# Patient Record
Sex: Male | Born: 1940 | Race: Black or African American | Hispanic: No | Marital: Married | Smoking: Former smoker
Health system: Southern US, Community
[De-identification: ages and names within clinical notes are randomized; demographics above are authoritative.]

## PROBLEM LIST (undated history)

## (undated) DIAGNOSIS — G4730 Sleep apnea, unspecified: Secondary | ICD-10-CM

## (undated) DIAGNOSIS — D509 Iron deficiency anemia, unspecified: Secondary | ICD-10-CM

## (undated) DIAGNOSIS — I10 Essential (primary) hypertension: Secondary | ICD-10-CM

## (undated) DIAGNOSIS — N400 Enlarged prostate without lower urinary tract symptoms: Secondary | ICD-10-CM

## (undated) DIAGNOSIS — J309 Allergic rhinitis, unspecified: Secondary | ICD-10-CM

## (undated) DIAGNOSIS — N529 Male erectile dysfunction, unspecified: Secondary | ICD-10-CM

## (undated) DIAGNOSIS — Z860101 Personal history of adenomatous and serrated colon polyps: Secondary | ICD-10-CM

## (undated) DIAGNOSIS — E119 Type 2 diabetes mellitus without complications: Secondary | ICD-10-CM

## (undated) DIAGNOSIS — N433 Hydrocele, unspecified: Secondary | ICD-10-CM

## (undated) DIAGNOSIS — I5189 Other ill-defined heart diseases: Secondary | ICD-10-CM

## (undated) DIAGNOSIS — E7800 Pure hypercholesterolemia, unspecified: Secondary | ICD-10-CM

## (undated) DIAGNOSIS — D126 Benign neoplasm of colon, unspecified: Secondary | ICD-10-CM

## (undated) DIAGNOSIS — Z8619 Personal history of other infectious and parasitic diseases: Secondary | ICD-10-CM

## (undated) DIAGNOSIS — J449 Chronic obstructive pulmonary disease, unspecified: Secondary | ICD-10-CM

## (undated) DIAGNOSIS — M170 Bilateral primary osteoarthritis of knee: Secondary | ICD-10-CM

## (undated) DIAGNOSIS — E790 Hyperuricemia without signs of inflammatory arthritis and tophaceous disease: Secondary | ICD-10-CM

## (undated) DIAGNOSIS — E785 Hyperlipidemia, unspecified: Secondary | ICD-10-CM

## (undated) DIAGNOSIS — B9681 Helicobacter pylori [H. pylori] as the cause of diseases classified elsewhere: Secondary | ICD-10-CM

## (undated) DIAGNOSIS — D709 Neutropenia, unspecified: Secondary | ICD-10-CM

## (undated) HISTORY — PX: BACK SURGERY: SHX140

## (undated) HISTORY — PX: JOINT REPLACEMENT: SHX530

---

## 2001-11-08 HISTORY — PX: LUMBAR LAMINECTOMY: SHX95

## 2001-11-09 ENCOUNTER — Encounter: Admission: RE | Admit: 2001-11-09 | Discharge: 2001-11-09 | Payer: Self-pay | Admitting: Unknown Physician Specialty

## 2001-11-09 ENCOUNTER — Encounter: Payer: Self-pay | Admitting: Unknown Physician Specialty

## 2004-06-10 HISTORY — PX: KNEE ARTHROSCOPY: SUR90

## 2004-11-26 ENCOUNTER — Other Ambulatory Visit: Payer: Self-pay

## 2004-11-30 ENCOUNTER — Ambulatory Visit: Payer: Self-pay | Admitting: Orthopaedic Surgery

## 2005-06-10 HISTORY — PX: BACK SURGERY: SHX140

## 2005-10-15 ENCOUNTER — Ambulatory Visit: Payer: Self-pay | Admitting: Internal Medicine

## 2006-06-10 HISTORY — PX: COLONOSCOPY: SHX174

## 2006-06-16 ENCOUNTER — Ambulatory Visit: Payer: Self-pay | Admitting: Unknown Physician Specialty

## 2006-06-16 DIAGNOSIS — Z860101 Personal history of adenomatous and serrated colon polyps: Secondary | ICD-10-CM | POA: Insufficient documentation

## 2006-08-05 ENCOUNTER — Ambulatory Visit: Payer: Self-pay | Admitting: Orthopaedic Surgery

## 2006-08-09 HISTORY — PX: KNEE ARTHROSCOPY: SHX127

## 2006-08-15 ENCOUNTER — Other Ambulatory Visit: Payer: Self-pay

## 2006-08-15 ENCOUNTER — Ambulatory Visit: Payer: Self-pay | Admitting: Orthopaedic Surgery

## 2006-08-19 ENCOUNTER — Ambulatory Visit: Payer: Self-pay | Admitting: Orthopaedic Surgery

## 2011-06-11 HISTORY — PX: COLONOSCOPY: SHX174

## 2011-10-14 ENCOUNTER — Ambulatory Visit: Payer: Self-pay | Admitting: Unknown Physician Specialty

## 2014-02-17 DIAGNOSIS — J449 Chronic obstructive pulmonary disease, unspecified: Secondary | ICD-10-CM | POA: Insufficient documentation

## 2014-02-17 DIAGNOSIS — G4733 Obstructive sleep apnea (adult) (pediatric): Secondary | ICD-10-CM | POA: Insufficient documentation

## 2014-03-02 DIAGNOSIS — E7800 Pure hypercholesterolemia, unspecified: Secondary | ICD-10-CM | POA: Insufficient documentation

## 2014-03-02 DIAGNOSIS — E1159 Type 2 diabetes mellitus with other circulatory complications: Secondary | ICD-10-CM | POA: Insufficient documentation

## 2014-03-02 DIAGNOSIS — N401 Enlarged prostate with lower urinary tract symptoms: Secondary | ICD-10-CM | POA: Insufficient documentation

## 2014-03-02 DIAGNOSIS — J309 Allergic rhinitis, unspecified: Secondary | ICD-10-CM | POA: Insufficient documentation

## 2014-03-02 DIAGNOSIS — I5189 Other ill-defined heart diseases: Secondary | ICD-10-CM | POA: Insufficient documentation

## 2014-03-02 DIAGNOSIS — N529 Male erectile dysfunction, unspecified: Secondary | ICD-10-CM | POA: Insufficient documentation

## 2014-09-25 ENCOUNTER — Ambulatory Visit
Admit: 2014-09-25 | Disposition: A | Discharge status: Home / Self Care | Payer: Self-pay | Attending: Family Medicine | Admitting: Family Medicine

## 2015-03-08 DIAGNOSIS — Z79899 Other long term (current) drug therapy: Secondary | ICD-10-CM | POA: Insufficient documentation

## 2015-03-08 DIAGNOSIS — N433 Hydrocele, unspecified: Secondary | ICD-10-CM | POA: Insufficient documentation

## 2015-03-31 DIAGNOSIS — E1169 Type 2 diabetes mellitus with other specified complication: Secondary | ICD-10-CM | POA: Insufficient documentation

## 2015-09-19 DIAGNOSIS — D709 Neutropenia, unspecified: Secondary | ICD-10-CM | POA: Insufficient documentation

## 2016-05-16 ENCOUNTER — Ambulatory Visit
Admission: EM | Admit: 2016-05-16 | Discharge: 2016-05-16 | Disposition: A | Discharge status: Home / Self Care | Payer: Commercial Managed Care - HMO | Attending: Family Medicine | Admitting: Family Medicine

## 2016-05-16 DIAGNOSIS — J40 Bronchitis, not specified as acute or chronic: Secondary | ICD-10-CM | POA: Diagnosis not present

## 2016-05-16 HISTORY — DX: Essential (primary) hypertension: I10

## 2016-05-16 HISTORY — DX: Hyperlipidemia, unspecified: E78.5

## 2016-05-16 MED ORDER — ALBUTEROL SULFATE HFA 108 (90 BASE) MCG/ACT IN AERS: 1.0000 | INHALATION_SPRAY | Freq: Four times a day (QID) | RESPIRATORY_TRACT | 0 refills | Status: AC | PRN

## 2016-05-16 MED ORDER — PREDNISONE 10 MG PO TABS: ORAL_TABLET | ORAL | 0 refills | Status: DC

## 2016-05-16 MED ORDER — AZITHROMYCIN 250 MG PO TABS: ORAL_TABLET | ORAL | 0 refills | Status: DC

## 2016-05-16 MED ORDER — ALBUTEROL SULFATE HFA 108 (90 BASE) MCG/ACT IN AERS: 1.0000 | INHALATION_SPRAY | Freq: Four times a day (QID) | RESPIRATORY_TRACT | 0 refills | Status: DC | PRN

## 2016-05-16 NOTE — ED Triage Notes (Addendum)
Pt c/o chest congestion and a cough for over a week. His voice comes and goes and his cough is productive. Facial pressure, and sweats.

## 2016-05-16 NOTE — ED Provider Notes (Signed)
CSN: [id number]     Arrival date & time 05/16/16  1220 History   First MD Initiated Contact with Patient 05/16/16 1233     Chief Complaint  Patient presents with  . Nasal Congestion   (Consider location/radiation/quality/duration/timing/severity/associated sxs/prior Treatment) Patient c/o persistent cough and some wheezing which are worse at night time.  He has hx of Copd and uses spiriva LABA and ICS for maintenance.  He has a SABA albuterol MDI which he has not used in awhile.  He states he thinks it is a couple years old.  He states he has had sx's for over 2 weeks.  He reports his sputum is yellow.  He has been having some DOE.  He does not currently smoke.  He has hx of mild Copd.   The history is provided by the patient.  Cough  Cough characteristics:  Productive Sputum characteristics:  Yellow Severity:  Moderate Onset quality:  Gradual Duration:  2 weeks Timing:  Intermittent Progression:  Worsening Chronicity:  New Smoker: no   Context: upper respiratory infection, weather changes and with activity   Relieved by:  Nothing Worsened by:  Activity, deep breathing, exposure to cold air and environmental changes Ineffective treatments:  Beta-agonist inhaler Associated symptoms: rhinorrhea, shortness of breath and wheezing   Risk factors: recent infection     Past Medical History:  Diagnosis Date  . Hyperlipidemia   . Hypertension    History reviewed. No pertinent surgical history. History reviewed. No pertinent family history. Social History  Substance Use Topics  . Smoking status: Never Smoker  . Smokeless tobacco: Never Used  . Alcohol use Yes    Review of Systems  Constitutional: Positive for fatigue.  HENT: Positive for congestion, rhinorrhea, sneezing and voice change.   Eyes: Negative.   Respiratory: Positive for cough, shortness of breath and wheezing.   Cardiovascular: Negative.   Gastrointestinal: Negative.   Endocrine: Negative.   Genitourinary:  Negative.   Musculoskeletal: Negative.   Allergic/Immunologic: Negative.   Neurological: Negative.   Hematological: Negative.   Psychiatric/Behavioral: Negative.     Allergies  Penicillins and Sulfa antibiotics  Home Medications   Prior to Admission medications   Medication Sig Start Date End Date Taking? Authorizing Provider  budesonide-formoterol (SYMBICORT) 160-4.5 MCG/ACT inhaler Inhale 2 puffs into the lungs 2 (two) times daily.   Yes Historical Provider, MD  felodipine (PLENDIL) 5 MG 24 hr tablet Take 5 mg by mouth daily.   Yes Historical Provider, MD  lisinopril-hydrochlorothiazide (PRINZIDE,ZESTORETIC) 20-12.5 MG tablet Take 1 tablet by mouth daily.   Yes Historical Provider, MD  simvastatin (ZOCOR) 40 MG tablet Take 40 mg by mouth daily.   Yes Historical Provider, MD  tamsulosin (FLOMAX) 0.4 MG CAPS capsule Take 0.4 mg by mouth.   Yes Historical Provider, MD  albuterol (PROVENTIL HFA;VENTOLIN HFA) 108 (90 Base) MCG/ACT inhaler Inhale 1 puff into the lungs every 6 (six) hours as needed for wheezing or shortness of breath. 05/16/16   Deatra CanterWilliam J Oxford, FNP  azithromycin (ZITHROMAX) 250 MG tablet Take 2 po first day and then one po qd x 4 days 05/16/16   Deatra CanterWilliam J Oxford, FNP  predniSONE (DELTASONE) 10 MG tablet Take 4 po qd 2d then 2 po qd x 2d then 1 po qd x 2d then stop 05/16/16   Deatra CanterWilliam J Oxford, FNP   Meds Ordered and Administered this Visit  Medications - No data to display  BP (!) 165/81 (BP Location: Left Arm)   Pulse 68  Temp 98.1 F (36.7 C) (Oral)   Resp 18   Ht 5\' 8"  (1.727 m)   Wt 240 lb (108.9 kg)   SpO2 98%   BMI 36.49 kg/m  No data found.   Physical Exam  Constitutional: He is oriented to person, place, and time. He appears well-developed and well-nourished.  HENT:  Head: Normocephalic and atraumatic.  Right Ear: External ear normal.  Left Ear: External ear normal.  Mouth/Throat: Oropharynx is clear and moist.  Eyes: Conjunctivae and EOM are normal.  Pupils are equal, round, and reactive to light.  Neck: Normal range of motion. Neck supple.  Cardiovascular: Normal rate, regular rhythm and normal heart sounds.   Pulmonary/Chest: He has wheezes.  Long expiratory phase with scattered wheezes throughout.  Abdominal: Soft. Bowel sounds are normal.  Neurological: He is alert and oriented to person, place, and time.  Nursing note and vitals reviewed.   Urgent Care Course   Clinical Course     Procedures (including critical care time)  Labs Review Labs Reviewed - No data to display  Imaging Review No results found.   Visual Acuity Review  Right Eye Distance:   Left Eye Distance:   Bilateral Distance:    Right Eye Near:   Left Eye Near:    Bilateral Near:         MDM   1. Bronchitis   2. Copd exacerbation   Prednisone 10mg  4po qd x 2d then 2 po qd x 2d then 1 po qd x 2d then stop #14 Zithromax Pak as directed #6 Albuterol MDI 1-2 puffs q 6 hours prn #1 Get Robitussin DM or Delsyn cough syrup otc and take as directed.  Push po fluids, rest, tylenol and motrin otc prn as directed for fever, arthralgias, and myalgias.  Follow up prn if sx's continue or persist or worsen.  Advised to follow up with PCP in a week.     Deatra CanterWilliam J Oxford, FNP 05/16/16 1315

## 2016-05-19 ENCOUNTER — Telehealth: Payer: Self-pay

## 2016-05-19 NOTE — Telephone Encounter (Signed)
Courtesy call back completed today after patient's visit at [facility]. Patient improved and will call back with any questions or concerns.  

## 2016-09-16 IMAGING — CR DG ANKLE COMPLETE 3+V*L*
3 series · 3 of 3 positions shown · non-contrast
Comparison: None.

CLINICAL DATA: Medial and lateral left ankle pain after missing a
step and twisting his left ankle.

EXAM:
LEFT ANKLE COMPLETE - 3+ VIEW

[ankle ap]
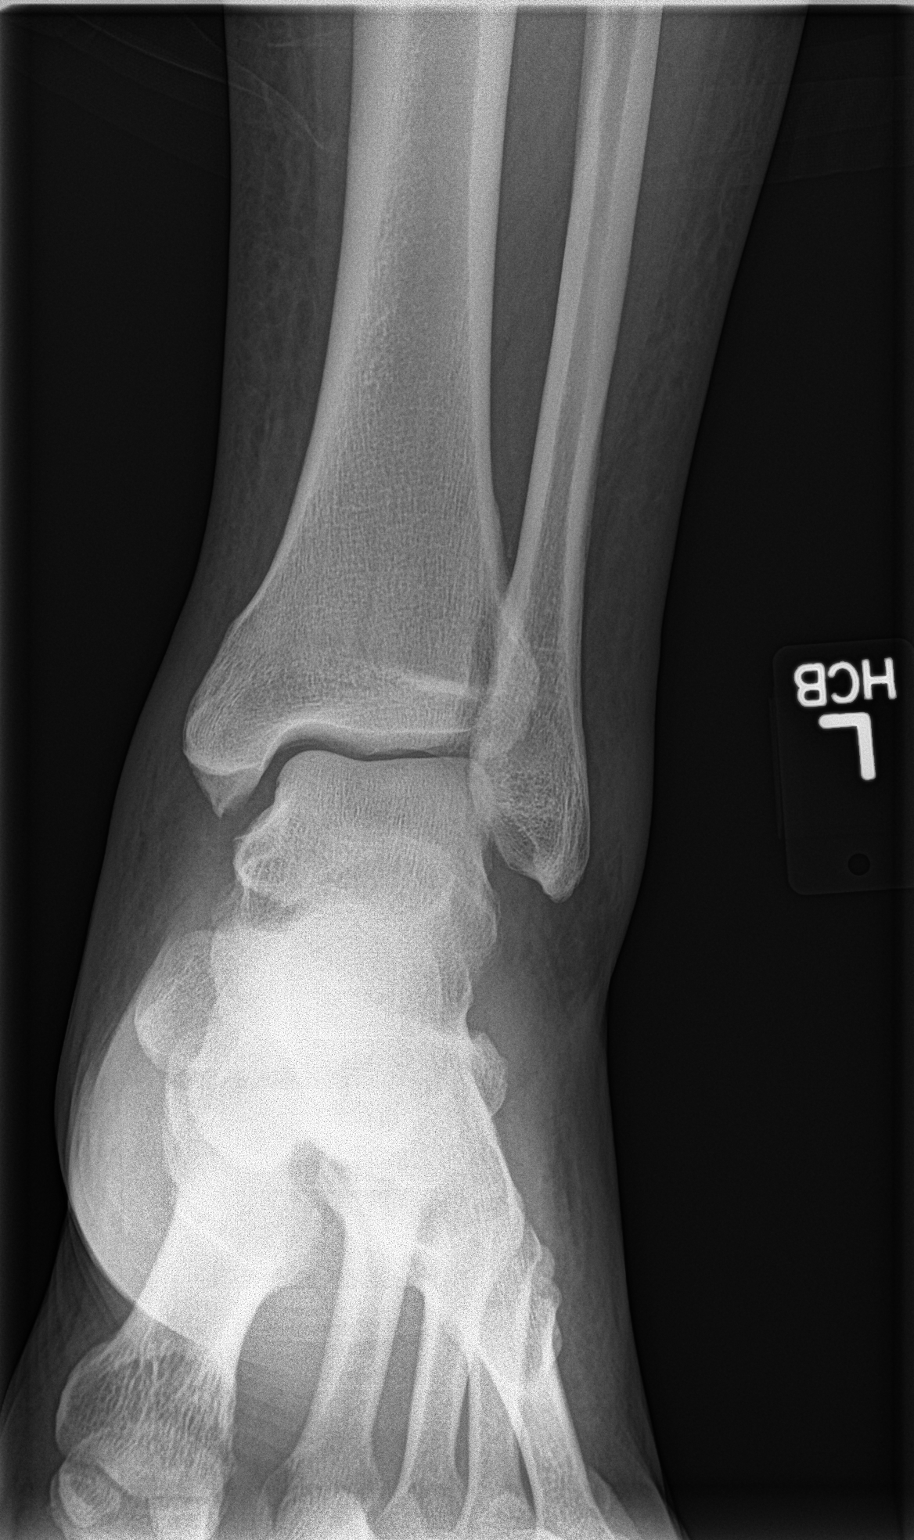

[ankle obl]
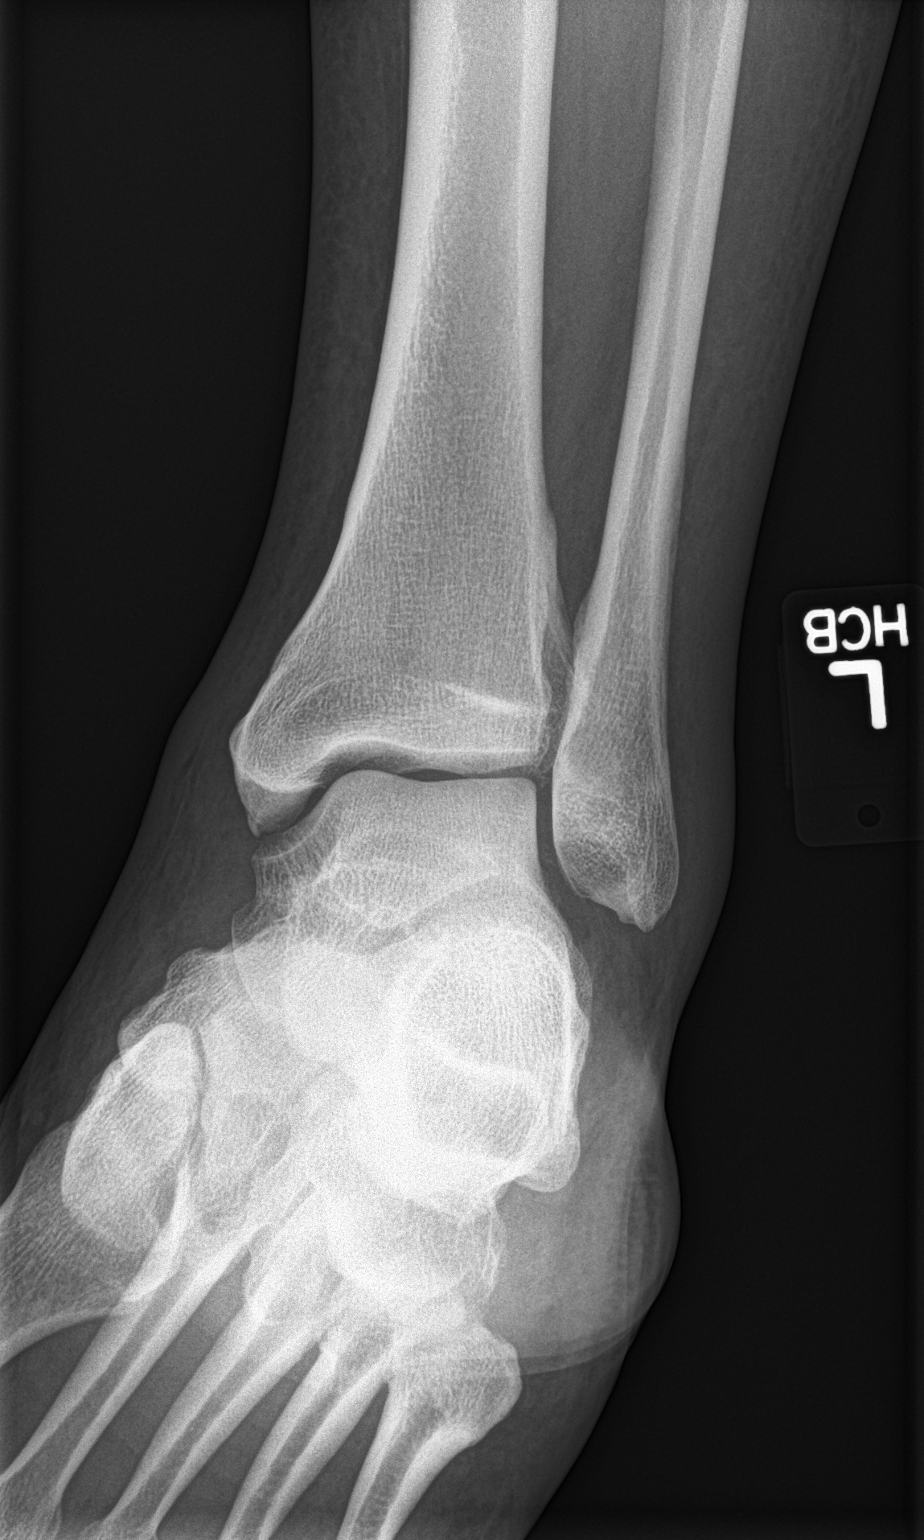

[ankle lat]
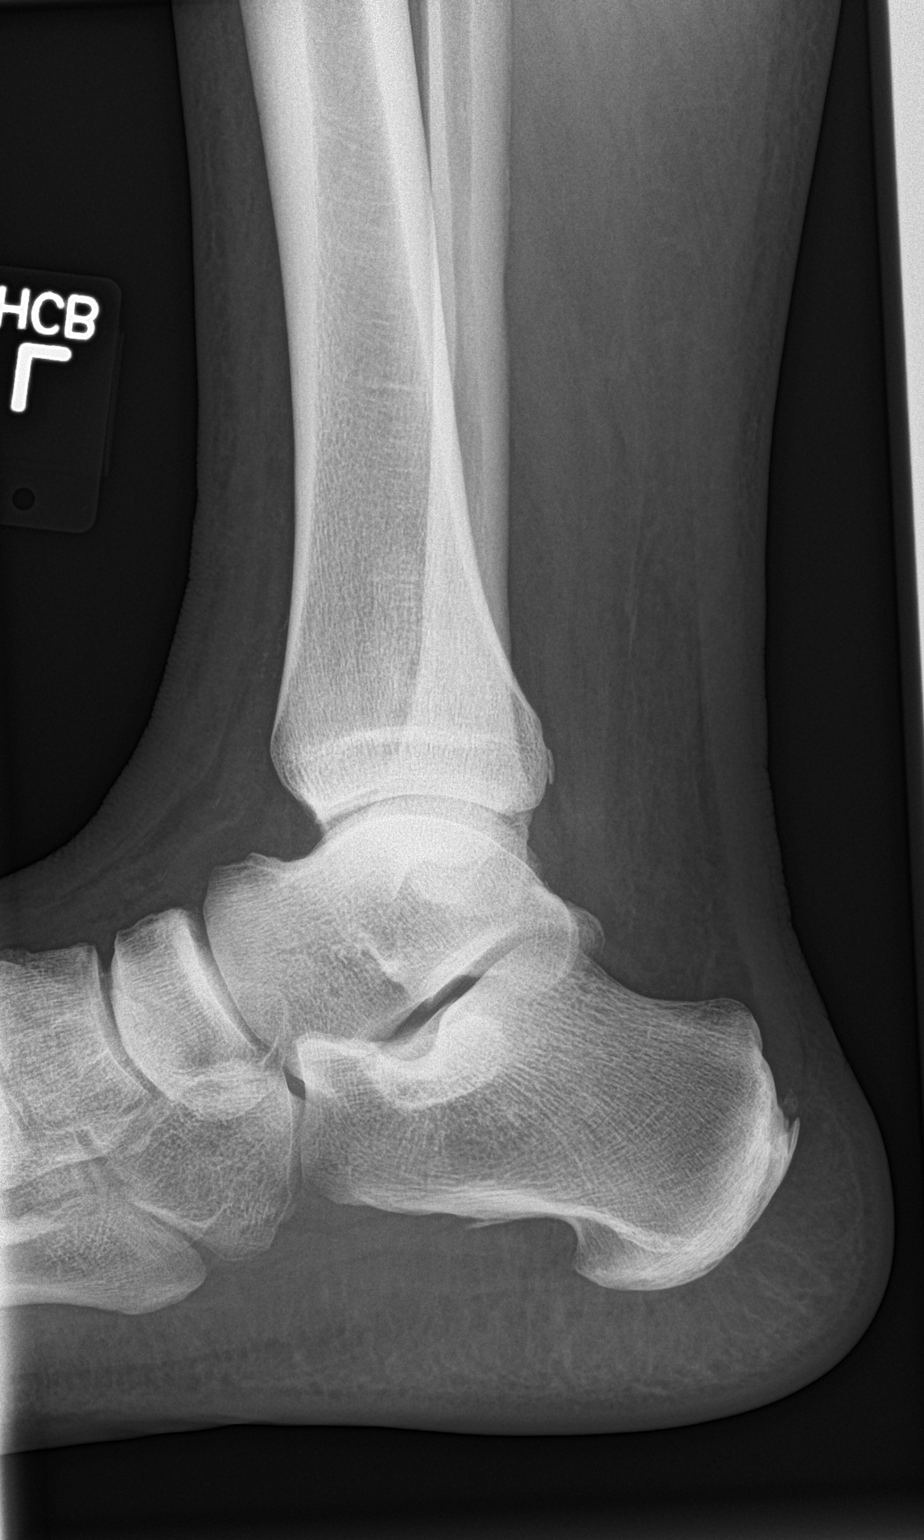

[3 of 3 positions shown; findings below may reference images not displayed]

FINDINGS: Diffuse soft tissue swelling. Linear calcific density along the
inferior aspect of the mid calcaneus. Mild posterior calcaneal spur
formation. Small distal medial malleolus spur. No effusion seen.
IMPRESSION: Possible small linear avulsion fracture off the inferior aspect of
the mid calcaneus.

## 2016-10-02 DIAGNOSIS — E790 Hyperuricemia without signs of inflammatory arthritis and tophaceous disease: Secondary | ICD-10-CM | POA: Insufficient documentation

## 2016-10-09 DIAGNOSIS — N138 Other obstructive and reflux uropathy: Secondary | ICD-10-CM | POA: Insufficient documentation

## 2016-10-09 DIAGNOSIS — R339 Retention of urine, unspecified: Secondary | ICD-10-CM | POA: Insufficient documentation

## 2017-07-22 ENCOUNTER — Encounter: Payer: Self-pay | Admitting: *Deleted

## 2017-07-23 ENCOUNTER — Ambulatory Visit: Payer: Medicare HMO | Admitting: Anesthesiology

## 2017-07-23 ENCOUNTER — Ambulatory Visit
Admission: RE | Admit: 2017-07-23 | Discharge: 2017-07-23 | Disposition: A | Discharge status: Home / Self Care | Payer: Medicare HMO | Source: Ambulatory Visit | Attending: Unknown Physician Specialty | Admitting: Unknown Physician Specialty

## 2017-07-23 ENCOUNTER — Encounter
Admission: RE | Disposition: A | Discharge status: Home / Self Care | Payer: Self-pay | Source: Ambulatory Visit | Attending: Unknown Physician Specialty

## 2017-07-23 ENCOUNTER — Other Ambulatory Visit: Payer: Self-pay

## 2017-07-23 ENCOUNTER — Encounter: Payer: Self-pay | Admitting: *Deleted

## 2017-07-23 DIAGNOSIS — K2950 Unspecified chronic gastritis without bleeding: Secondary | ICD-10-CM | POA: Diagnosis not present

## 2017-07-23 DIAGNOSIS — I10 Essential (primary) hypertension: Secondary | ICD-10-CM | POA: Diagnosis not present

## 2017-07-23 DIAGNOSIS — Z1211 Encounter for screening for malignant neoplasm of colon: Secondary | ICD-10-CM | POA: Insufficient documentation

## 2017-07-23 DIAGNOSIS — Z79899 Other long term (current) drug therapy: Secondary | ICD-10-CM | POA: Insufficient documentation

## 2017-07-23 DIAGNOSIS — Z86010 Personal history of colonic polyps: Secondary | ICD-10-CM | POA: Diagnosis not present

## 2017-07-23 DIAGNOSIS — Z6835 Body mass index (BMI) 35.0-35.9, adult: Secondary | ICD-10-CM | POA: Diagnosis not present

## 2017-07-23 DIAGNOSIS — E119 Type 2 diabetes mellitus without complications: Secondary | ICD-10-CM | POA: Diagnosis not present

## 2017-07-23 DIAGNOSIS — B9681 Helicobacter pylori [H. pylori] as the cause of diseases classified elsewhere: Secondary | ICD-10-CM | POA: Diagnosis not present

## 2017-07-23 DIAGNOSIS — E7800 Pure hypercholesterolemia, unspecified: Secondary | ICD-10-CM | POA: Diagnosis not present

## 2017-07-23 DIAGNOSIS — K449 Diaphragmatic hernia without obstruction or gangrene: Secondary | ICD-10-CM | POA: Diagnosis not present

## 2017-07-23 DIAGNOSIS — G4730 Sleep apnea, unspecified: Secondary | ICD-10-CM | POA: Diagnosis not present

## 2017-07-23 DIAGNOSIS — D509 Iron deficiency anemia, unspecified: Secondary | ICD-10-CM | POA: Insufficient documentation

## 2017-07-23 DIAGNOSIS — J449 Chronic obstructive pulmonary disease, unspecified: Secondary | ICD-10-CM | POA: Diagnosis not present

## 2017-07-23 DIAGNOSIS — K640 First degree hemorrhoids: Secondary | ICD-10-CM | POA: Insufficient documentation

## 2017-07-23 DIAGNOSIS — Z880 Allergy status to penicillin: Secondary | ICD-10-CM | POA: Diagnosis not present

## 2017-07-23 DIAGNOSIS — K3189 Other diseases of stomach and duodenum: Secondary | ICD-10-CM | POA: Insufficient documentation

## 2017-07-23 DIAGNOSIS — Z800 Family history of malignant neoplasm of digestive organs: Secondary | ICD-10-CM | POA: Insufficient documentation

## 2017-07-23 DIAGNOSIS — Z882 Allergy status to sulfonamides status: Secondary | ICD-10-CM | POA: Insufficient documentation

## 2017-07-23 HISTORY — DX: Type 2 diabetes mellitus without complications: E11.9

## 2017-07-23 HISTORY — PX: COLONOSCOPY WITH PROPOFOL: SHX5780

## 2017-07-23 HISTORY — DX: Hyperuricemia without signs of inflammatory arthritis and tophaceous disease: E79.0

## 2017-07-23 HISTORY — DX: Morbid (severe) obesity due to excess calories: E66.01

## 2017-07-23 HISTORY — DX: Pure hypercholesterolemia, unspecified: E78.00

## 2017-07-23 HISTORY — DX: Sleep apnea, unspecified: G47.30

## 2017-07-23 HISTORY — DX: Neutropenia, unspecified: D70.9

## 2017-07-23 HISTORY — PX: ESOPHAGOGASTRODUODENOSCOPY (EGD) WITH PROPOFOL: SHX5813

## 2017-07-23 HISTORY — DX: Chronic obstructive pulmonary disease, unspecified: J44.9

## 2017-07-23 LAB — GLUCOSE, CAPILLARY: GLUCOSE-CAPILLARY: 86 mg/dL (ref 65–99)

## 2017-07-23 SURGERY — COLONOSCOPY WITH PROPOFOL
Anesthesia: General

## 2017-07-23 MED ORDER — PROPOFOL 500 MG/50ML IV EMUL
INTRAVENOUS | Status: AC
  Filled 2017-07-23: qty 50

## 2017-07-23 MED ORDER — PHENYLEPHRINE HCL 10 MG/ML IJ SOLN
INTRAMUSCULAR | Status: DC | PRN
  Administered 2017-07-23: 50 ug via INTRAVENOUS
  Administered 2017-07-23: 100 ug via INTRAVENOUS

## 2017-07-23 MED ORDER — SODIUM CHLORIDE 0.9 % IV SOLN
INTRAVENOUS | Status: DC
  Administered 2017-07-23: 09:00:00 via INTRAVENOUS

## 2017-07-23 MED ORDER — PROPOFOL 500 MG/50ML IV EMUL
INTRAVENOUS | Status: DC | PRN
  Administered 2017-07-23: 160 ug/kg/min via INTRAVENOUS

## 2017-07-23 MED ORDER — LIDOCAINE HCL (PF) 2 % IJ SOLN
INTRAMUSCULAR | Status: AC
  Filled 2017-07-23: qty 10

## 2017-07-23 MED ORDER — LIDOCAINE HCL (CARDIAC) 20 MG/ML IV SOLN
INTRAVENOUS | Status: DC | PRN
  Administered 2017-07-23: 100 mg via INTRATRACHEAL

## 2017-07-23 MED ORDER — FENTANYL CITRATE (PF) 100 MCG/2ML IJ SOLN
INTRAMUSCULAR | Status: DC | PRN
  Administered 2017-07-23: 50 ug via INTRAVENOUS

## 2017-07-23 MED ORDER — EPHEDRINE SULFATE 50 MG/ML IJ SOLN
INTRAMUSCULAR | Status: DC | PRN
  Administered 2017-07-23 (×2): 7.5 mg via INTRAVENOUS

## 2017-07-23 MED ORDER — SODIUM CHLORIDE 0.9 % IV SOLN: INTRAVENOUS | Status: DC

## 2017-07-23 MED ORDER — FENTANYL CITRATE (PF) 100 MCG/2ML IJ SOLN
INTRAMUSCULAR | Status: AC
  Filled 2017-07-23: qty 2

## 2017-07-23 NOTE — H&P (Signed)
Primary Care Physician:  Raynelle Bring Primary Gastroenterologist:  Dr. Mechele Collin  Pre-Procedure History & Physical: HPI:  Mason Mueller is a 77 y.o. male is here for an endoscopy and colonoscopy.   Past Medical History:  Diagnosis Date  . COPD (chronic obstructive pulmonary disease) (HCC)   . Diabetes mellitus without complication (HCC)   . Hyperlipidemia   . Hypertension   . Hyperuricemia   . Morbid obesity (HCC)   . Neutropenia (HCC)   . Pure hypercholesterolemia   . Sleep apnea     Past Surgical History:  Procedure Laterality Date  . BACK SURGERY  2007  . COLONOSCOPY    . KNEE ARTHROSCOPY Bilateral     Prior to Admission medications   Medication Sig Start Date End Date Taking? Authorizing Provider  albuterol (PROVENTIL HFA;VENTOLIN HFA) 108 (90 Base) MCG/ACT inhaler Inhale 1 puff into the lungs every 6 (six) hours as needed for wheezing or shortness of breath. 05/16/16  Yes Deatra Canter, FNP  budesonide-formoterol (SYMBICORT) 160-4.5 MCG/ACT inhaler Inhale 2 puffs into the lungs 2 (two) times daily.   Yes [provider]  felodipine (PLENDIL) 5 MG 24 hr tablet Take 5 mg by mouth daily.   Yes [provider]  lisinopril-hydrochlorothiazide (PRINZIDE,ZESTORETIC) 20-12.5 MG tablet Take 1 tablet by mouth daily.   Yes [provider]  simvastatin (ZOCOR) 40 MG tablet Take 40 mg by mouth daily.   Yes [provider]  tamsulosin (FLOMAX) 0.4 MG CAPS capsule Take 0.4 mg by mouth.   Yes [provider]  azithromycin (ZITHROMAX) 250 MG tablet Take 2 po first day and then one po qd x 4 days Patient not taking: Reported on 07/23/2017 05/16/16   Deatra Canter, FNP  predniSONE (DELTASONE) 10 MG tablet Take 4 po qd 2d then 2 po qd x 2d then 1 po qd x 2d then stop Patient not taking: Reported on 07/23/2017 05/16/16   Deatra Canter, FNP    Allergies as of 05/22/2017 - Review Complete 05/16/2016  Allergen Reaction Noted  .  Penicillins Hives 05/16/2016  . Sulfa antibiotics Rash 05/16/2016    History reviewed. No pertinent family history.  Social History   Socioeconomic History  . Marital status: Married    Spouse name: Not on file  . Number of children: Not on file  . Years of education: Not on file  . Highest education level: Not on file  Social Needs  . Financial resource strain: Not on file  . Food insecurity - worry: Not on file  . Food insecurity - inability: Not on file  . Transportation needs - medical: Not on file  . Transportation needs - non-medical: Not on file  Occupational History  . Not on file  Tobacco Use  . Smoking status: Never Smoker  . Smokeless tobacco: Never Used  Substance and Sexual Activity  . Alcohol use: Yes    Comment: occ  . Drug use: No  . Sexual activity: Not on file  Other Topics Concern  . Not on file  Social History Narrative  . Not on file    Review of Systems: See HPI, otherwise negative ROS  Physical Exam: BP (!) 155/76   Pulse 67   Temp (!) 97.3 F (36.3 C) (Tympanic)   Resp 20   Ht 5\' 8"  (1.727 m)   Wt 106.1 kg (234 lb)   SpO2 99%   BMI 35.58 kg/m  General:   Alert,  pleasant and cooperative in  NAD Head:  Normocephalic and atraumatic. Neck:  Supple; no masses or thyromegaly. Lungs:  Clear throughout to auscultation.    Heart:  Regular rate and rhythm. Abdomen:  Soft, nontender and nondistended. Normal bowel sounds, without guarding, and without rebound.   Neurologic:  Alert and  oriented x4;  grossly normal neurologically.  Impression/Plan: Denver FasterLarry V Battenfield is here for an endoscopy and colonoscopy [facility] and FH colon cancer in a brother  Risks, benefits, limitations, and alternatives regarding  endoscopy and colonoscopy have been reviewed with the patient.  Questions have been answered.  All parties agreeable.   Lynnae PrudeELLIOTT, Pinchos Topel, MD  07/23/2017, 8:50 AM

## 2017-07-23 NOTE — Anesthesia Post-op Follow-up Note (Signed)
Anesthesia QCDR form completed.        

## 2017-07-23 NOTE — Op Note (Signed)
[facility] Gastroenterology Patient Name: Mason JupiterLarry Dilley Procedure Date: 07/23/2017 8:53 AM MRN: [id number] Account #: [id number] Date of Birth: 11/27/1940 Admit Type: Outpatient Age: [age] [facility] Gender: Male Note Status: Finalized Procedure:            Colonoscopy Indications:          High risk colon cancer surveillance: Personal history                        of colonic polyps Providers:            Scot Junobert T. Ulyses Panico, MD Referring MD:         Neomia Dearavid N. Harrington Challengerhies, MD (Referring MD) Medicines:            Propofol per Anesthesia Complications:        No immediate complications. Procedure:            Pre-Anesthesia Assessment:                       - After reviewing the risks and benefits, the patient                        was deemed in satisfactory condition to undergo the                        procedure.                       After obtaining informed consent, the colonoscope was                        passed under direct vision. Throughout the procedure,                        the patient's blood pressure, pulse, and oxygen                        saturations were monitored continuously. The                        Colonoscope was introduced through the anus and                        advanced to the the cecum, identified by appendiceal                        orifice and ileocecal valve. The colonoscopy was                        performed without difficulty. The patient tolerated the                        procedure well. The quality of the bowel preparation                        was excellent. Findings:      Internal hemorrhoids were found during endoscopy. The hemorrhoids were       small and Grade I (internal hemorrhoids that do not prolapse).      The exam was otherwise without abnormality. Impression:           -  Internal hemorrhoids.                       - The examination was otherwise normal.                       - No specimens  collected. Recommendation:       - Repeat colonoscopy in 5 years for surveillance. Scot Jun, MD 07/23/2017 9:29:36 AM This report has been signed electronically. Number of Addenda: 0 Note Initiated On: 07/23/2017 8:53 AM Scope Withdrawal Time: 0 hours 5 minutes 5 seconds  Total Procedure Duration: 0 hours 12 minutes 24 seconds       [facility]

## 2017-07-23 NOTE — Anesthesia Preprocedure Evaluation (Signed)
Anesthesia Evaluation  Patient identified by MRN, date of birth, ID band Patient awake    Reviewed: Allergy & Precautions, H&P , NPO status , reviewed documented beta blocker date and time   Airway Mallampati: II  TM Distance: >3 FB     Dental  (+) Caps, Dental Advidsory Given   Pulmonary sleep apnea , COPD,    Pulmonary exam normal        Cardiovascular hypertension, Normal cardiovascular exam     Neuro/Psych    GI/Hepatic   Endo/Other  diabetes  Renal/GU      Musculoskeletal   Abdominal   Peds  Hematology   Anesthesia Other Findings   Reproductive/Obstetrics                             Anesthesia Physical Anesthesia Plan  ASA: III  Anesthesia Plan: General   Post-op Pain Management:    Induction:   PONV Risk Score and Plan: 2 and Propofol infusion  Airway Management Planned:   Additional Equipment:   Intra-op Plan:   Post-operative Plan:   Informed Consent: I have reviewed the patients History and Physical, chart, labs and discussed the procedure including the risks, benefits and alternatives for the proposed anesthesia with the patient or authorized representative who has indicated his/her understanding and acceptance.   Dental Advisory Given  Plan Discussed with: CRNA  Anesthesia Plan Comments:         Anesthesia Quick Evaluation

## 2017-07-23 NOTE — Anesthesia Postprocedure Evaluation (Signed)
Anesthesia Post Note  Patient: Denver FasterLarry V Charnley  Procedure(s) Performed: COLONOSCOPY WITH PROPOFOL (N/A ) ESOPHAGOGASTRODUODENOSCOPY (EGD) WITH PROPOFOL (N/A )  Patient location during evaluation: Endoscopy Anesthesia Type: General Level of consciousness: awake and alert Pain management: pain level controlled Vital Signs Assessment: post-procedure vital signs reviewed and stable Respiratory status: spontaneous breathing, nonlabored ventilation, respiratory function stable and patient connected to nasal cannula oxygen Cardiovascular status: blood pressure returned to baseline and stable Postop Assessment: no apparent nausea or vomiting Anesthetic complications: no     Last Vitals:  Vitals:   07/23/17 0950 07/23/17 1000  BP: 126/76 124/80  Pulse: 62 (!) 59  Resp: 17 12  Temp:    SpO2: 100% 100%    Last Pain:  Vitals:   07/23/17 0930  TempSrc: Tympanic                 Ludean Duhart Garry Heater Micha Erck

## 2017-07-23 NOTE — Op Note (Signed)
[facility] Gastroenterology Patient Name: Mason Mueller Procedure Date: 07/23/2017 8:54 AM MRN: [id number] Account #: [id number] Date of Birth: 1940-11-20 Admit Type: Outpatient Age: 77 Room: [facility] ENDO ROOM 3 Gender: Male Note Status: Finalized Procedure:            Upper GI endoscopy Indications:          Unexplained iron deficiency anemia Providers:            Scot Jun, MD Referring MD:         Neomia Dear. Harrington Challenger, MD (Referring MD) Medicines:            Propofol per Anesthesia Complications:        No immediate complications. Procedure:            Pre-Anesthesia Assessment:                       - After reviewing the risks and benefits, the patient                        was deemed in satisfactory condition to undergo the                        procedure.                       After obtaining informed consent, the endoscope was                        passed under direct vision. Throughout the procedure,                        the patient's blood pressure, pulse, and oxygen                        saturations were monitored continuously. The Endoscope                        was introduced through the mouth, and advanced to the                        second part of duodenum. The upper GI endoscopy was                        accomplished without difficulty. The patient tolerated                        the procedure well. Findings:      The examined esophagus was normal. Biopsies were taken with a cold       forceps for histology. GEJ 40cm.      A medium-sized hiatal hernia was present. Biopsies were taken with a       cold forceps for histology.      Diffuse mildly erythematous mucosa without bleeding was found in the       gastric body. Biopsies were taken with a cold forceps for histology.       Biopsies were taken with a cold forceps for Helicobacter pylori testing.      The examined duodenum was normal. Impression:           - Normal esophagus.  Biopsied.                       -  Medium-sized hiatal hernia. Biopsied.                       - Erythematous mucosa in the gastric body. Biopsied.                       - Normal examined duodenum. Recommendation:       - Await pathology results. Proceed with colonoscopy. Scot Junobert T Elliott, MD 07/23/2017 9:13:20 AM This report has been signed electronically. Number of Addenda: 0 Note Initiated On: 07/23/2017 8:54 AM      [facility]

## 2017-07-23 NOTE — Transfer of Care (Signed)
Immediate Anesthesia Transfer of Care Note  Patient: Mason Mueller  Procedure(s) Performed: COLONOSCOPY WITH PROPOFOL (N/A ) ESOPHAGOGASTRODUODENOSCOPY (EGD) WITH PROPOFOL (N/A )  Patient Location: PACU  Anesthesia Type:General  Level of Consciousness: awake  Airway & Oxygen Therapy: Patient Spontanous Breathing and Patient connected to nasal cannula oxygen  Post-op Assessment: Report given to RN and Post -op Vital signs reviewed and stable  Post vital signs: Reviewed and stable  Last Vitals:  Vitals:   07/23/17 0842 07/23/17 0930  BP: (!) 155/76 98/66  Pulse: 67 77  Resp: 20 20  Temp: (!) 36.3 C (!) 36 C  SpO2: 99% 100%    Last Pain:  Vitals:   07/23/17 0930  TempSrc: Tympanic      Patients Stated Pain Goal: 8 (07/23/17 0842)  Complications: No apparent anesthesia complications

## 2017-07-23 NOTE — Anesthesia Procedure Notes (Signed)
Date/Time: 07/23/2017 9:00 AM Performed by: Henrietta HooverPope, Herndon Grill, CRNA Pre-anesthesia Checklist: Patient identified, Emergency Drugs available, Suction available, Patient being monitored and Timeout performed Patient Re-evaluated:Patient Re-evaluated prior to induction Oxygen Delivery Method: Nasal cannula Placement Confirmation: positive ETCO2 Dental Injury: Teeth and Oropharynx as per pre-operative assessment

## 2017-07-24 ENCOUNTER — Encounter: Payer: Self-pay | Admitting: Unknown Physician Specialty

## 2017-07-25 LAB — SURGICAL PATHOLOGY

## 2018-02-11 DIAGNOSIS — Z8619 Personal history of other infectious and parasitic diseases: Secondary | ICD-10-CM | POA: Insufficient documentation

## 2018-02-12 DIAGNOSIS — M1712 Unilateral primary osteoarthritis, left knee: Secondary | ICD-10-CM | POA: Insufficient documentation

## 2018-06-20 ENCOUNTER — Ambulatory Visit
Admission: EM | Admit: 2018-06-20 | Discharge: 2018-06-20 | Disposition: A | Discharge status: Home / Self Care | Payer: Medicare HMO | Attending: Family Medicine | Admitting: Family Medicine

## 2018-06-20 ENCOUNTER — Other Ambulatory Visit: Payer: Self-pay

## 2018-06-20 DIAGNOSIS — J441 Chronic obstructive pulmonary disease with (acute) exacerbation: Secondary | ICD-10-CM | POA: Diagnosis not present

## 2018-06-20 MED ORDER — DOXYCYCLINE HYCLATE 100 MG PO TABS: 100.0000 mg | ORAL_TABLET | Freq: Two times a day (BID) | ORAL | 0 refills | Status: DC

## 2018-06-20 MED ORDER — BENZONATATE 200 MG PO CAPS: 200.0000 mg | ORAL_CAPSULE | Freq: Three times a day (TID) | ORAL | 0 refills | Status: DC | PRN

## 2018-06-20 MED ORDER — PREDNISONE 20 MG PO TABS: ORAL_TABLET | ORAL | 0 refills | Status: DC

## 2018-06-20 NOTE — ED Triage Notes (Addendum)
Pt with cough and nasal congestion. Scratchy throat. Coughing up phlegm mostly yellow and sometimes trace of blood. Starting into 2nd week of sx

## 2018-06-20 NOTE — ED Provider Notes (Signed)
[facility]    CSN: [id number] Arrival date & time: 06/20/18  1003     History   Chief Complaint Chief Complaint  Patient presents with  . Nasal Congestion    HPI Mason Mueller is a 78 y.o. male.   The history is provided by the patient.  URI  Presenting symptoms: congestion and cough   Severity:  Moderate Onset quality:  Sudden Duration:  2 weeks Timing:  Constant Progression:  Unchanged Chronicity:  New Relieved by:  None tried Ineffective treatments:  None tried Risk factors: chronic respiratory disease and sick contacts     Past Medical History:  Diagnosis Date  . COPD (chronic obstructive pulmonary disease) (HCC)   . Diabetes mellitus without complication (HCC)   . Hyperlipidemia   . Hypertension   . Hyperuricemia   . Morbid obesity (HCC)   . Neutropenia (HCC)   . Pure hypercholesterolemia   . Sleep apnea     There are no active problems to display for this patient.   Past Surgical History:  Procedure Laterality Date  . BACK SURGERY  2007  . COLONOSCOPY    . COLONOSCOPY WITH PROPOFOL N/A 07/23/2017   Procedure: COLONOSCOPY WITH PROPOFOL;  Surgeon: Scot JunElliott, Robert T, MD;  [facility] END[facility]COPY;  Service: Endoscopy;  Laterality: N/A;  . ESOPHAGOGASTRODUODENOSCOPY (EGD) WITH PROPOFOL N/A 07/23/2017   Procedure: ESOPHAGOGASTRODUODENOSCOPY (EGD) WITH PROPOFOL;  Surgeon: Scot JunElliott, Robert T, MD;  [facility];  Service: Endoscopy;  Laterality: N/A;  . KNEE ARTHROSCOPY Bilateral        Home Medications    Prior to Admission medications   Medication Sig Start Date End Date Taking? Authorizing Provider  doxazosin (CARDURA) 8 MG tablet Take by mouth. 05/26/18  Yes [provider]  Meth-Hyo-M Bl-Na Phos-Ph Sal (URIBEL) 118 MG CAPS Take by mouth. 05/26/18  Yes [provider]  albuterol (PROVENTIL HFA;VENTOLIN HFA) 108 (90 Base) MCG/ACT inhaler Inhale 1 puff into the lungs every 6 (six) hours as needed for  wheezing or shortness of breath. 05/16/16   Deatra Canterxford, William J, FNP  azithromycin (ZITHROMAX) 250 MG tablet Take 2 po first day and then one po qd x 4 days Patient not taking: Reported on 07/23/2017 05/16/16   Deatra Canterxford, William J, FNP  benzonatate (TESSALON) 200 MG capsule Take 1 capsule (200 mg total) by mouth 3 (three) times daily as needed. 06/20/18   Payton Mccallumonty, Elizandro Laura, MD  budesonide-formoterol [facility] An(SYMBICORT) 160-4.5 MCG/ACT inhaler Inhale 2 puffs into the lungs 2 (two) times daily.    [provider]  doxycycline (VIBRA-TABS) 100 MG tablet Take 1 tablet (100 mg total) by mouth 2 (two) times daily. 06/20/18   Payton Mccallumonty, Yuko Coventry, MD  felodipine (PLENDIL) 5 MG 24 hr tablet Take 5 mg by mouth daily.    [provider]  lisinopril-hydrochlorothiazide (PRINZIDE,ZESTORETIC) 20-12.5 MG tablet Take 1 tablet by mouth daily.    [provider]  predniSONE (DELTASONE) 20 MG tablet 3 tabs po once day 1, then 2 tabs po qd x 2 days, then 1 tab po qd x 2 days, then half a tab po qd x 2 days 06/20/18   Payton Mccallumonty, Mearl Harewood, MD  simvastatin (ZOCOR) 40 MG tablet Take 40 mg by mouth daily.    [provider]  tamsulosin (FLOMAX) 0.4 MG CAPS capsule Take 0.4 mg by mouth.    [provider]    Family History History reviewed. No pertinent family history.  Social History Social History   Tobacco Use  .  Smoking status: Never Smoker  . Smokeless tobacco: Never Used  Substance Use Topics  . Alcohol use: Yes    Comment: occ  . Drug use: No     Allergies   Penicillins and Sulfa antibiotics   Review of Systems Review of Systems  HENT: Positive for congestion.   Respiratory: Positive for cough.      Physical Exam Triage Vital Signs ED Triage Vitals  Enc Vitals Group     BP 06/20/18 1026 (!) 152/73     Pulse Rate 06/20/18 1026 83     Resp 06/20/18 1026 18     Temp 06/20/18 1026 98.6 F (37 C)     Temp Source 06/20/18 1026 Oral     SpO2 06/20/18 1026 94 %     Weight  06/20/18 1028 252 lb (114.3 kg)     Height 06/20/18 1028 5\' 8"  (1.727 m)     Head Circumference --      Peak Flow --      Pain Score 06/20/18 1028 0     Pain Loc --      Pain Edu? --      Excl. in GC? --    No data found.  Updated Vital Signs BP (!) 152/73 (BP Location: Left Arm)   Pulse 83 Comment: irregular, known arrhythmia  Temp 98.6 F (37 C) (Oral)   Resp 18   Ht 5\' 8"  (1.727 m)   Wt 114.3 kg   SpO2 94%   BMI 38.32 kg/m   Visual Acuity Right Eye Distance:   Left Eye Distance:   Bilateral Distance:    Right Eye Near:   Left Eye Near:    Bilateral Near:     Physical Exam Vitals signs and nursing note reviewed.  Constitutional:      General: He is not in acute distress.    Appearance: He is well-developed. He is not toxic-appearing or diaphoretic.  HENT:     Head: Normocephalic and atraumatic.     Nose: Nose normal.     Mouth/Throat:     Pharynx: Uvula midline. No oropharyngeal exudate.     Tonsils: No tonsillar abscesses.  Eyes:     General: No scleral icterus.       Right eye: No discharge.        Left eye: No discharge.  Neck:     Musculoskeletal: Normal range of motion and neck supple.     Thyroid: No thyromegaly.     Trachea: No tracheal deviation.  Cardiovascular:     Rate and Rhythm: Normal rate and regular rhythm.     Heart sounds: Normal heart sounds.  Pulmonary:     Effort: Pulmonary effort is normal. No respiratory distress.     Breath sounds: No stridor. Rhonchi present. No wheezing or rales.  Chest:     Chest wall: No tenderness.  Lymphadenopathy:     Cervical: No cervical adenopathy.  Skin:    General: Skin is warm and dry.     Findings: No rash.  Neurological:     Mental Status: He is alert.      UC Treatments / Results  Labs (all labs ordered are listed, but only abnormal results are displayed) Labs Reviewed - No data to display  EKG None  Radiology No results found.  Procedures Procedures (including critical care  time)  Medications Ordered in UC Medications - No data to display  Initial Impression / Assessment and Plan / UC Course  I have  reviewed the triage vital signs and the nursing notes.  Pertinent labs & imaging results that were available during my care of the patient were reviewed by me and considered in my medical decision making (see chart for details).      Final Clinical Impressions(s) / UC Diagnoses   Final diagnoses:  COPD exacerbation [facility])    ED Prescriptions    Medication Sig Dispense Auth. Provider   doxycycline (VIBRA-TABS) 100 MG tablet Take 1 tablet (100 mg total) by mouth 2 (two) times daily. 20 tablet Leonor Darnell, Pamala Hurry, MD   predniSONE (DELTASONE) 20 MG tablet 3 tabs po once day 1, then 2 tabs po qd x 2 days, then 1 tab po qd x 2 days, then half a tab po qd x 2 days 10 tablet Meliana Canner, Pamala Hurry, MD   benzonatate (TESSALON) 200 MG capsule Take 1 capsule (200 mg total) by mouth 3 (three) times daily as needed. 30 capsule Payton Mccallum, MD     1. diagnosis reviewed with patient 2. rx as per orders above; reviewed possible side effects, interactions, risks and benefits  3. Recommend continue current home inhalers 4. Follow-up prn if symptoms worsen or don't improve   Controlled Substance Prescriptions [place] Controlled Substance Registry consulted? Not Applicable   Payton Mccallum, MD 06/20/18 [phone]

## 2019-09-29 DIAGNOSIS — D509 Iron deficiency anemia, unspecified: Secondary | ICD-10-CM | POA: Insufficient documentation

## 2019-10-20 ENCOUNTER — Encounter: Payer: Self-pay | Admitting: Urology

## 2019-10-20 ENCOUNTER — Ambulatory Visit: Payer: Medicare HMO | Admitting: Urology

## 2019-10-20 ENCOUNTER — Other Ambulatory Visit: Payer: Self-pay

## 2019-10-20 VITALS — BP 182/81 | HR 55 | Ht 68.0 in | Wt 256.8 lb

## 2019-10-20 DIAGNOSIS — N401 Enlarged prostate with lower urinary tract symptoms: Secondary | ICD-10-CM | POA: Diagnosis not present

## 2019-10-20 DIAGNOSIS — N432 Other hydrocele: Secondary | ICD-10-CM | POA: Diagnosis not present

## 2019-10-20 DIAGNOSIS — N138 Other obstructive and reflux uropathy: Secondary | ICD-10-CM

## 2019-10-20 DIAGNOSIS — Z125 Encounter for screening for malignant neoplasm of prostate: Secondary | ICD-10-CM

## 2019-10-20 LAB — BLADDER SCAN AMB NON-IMAGING

## 2019-10-20 NOTE — Progress Notes (Signed)
10/20/19 1:25 PM   Mason Mueller 1941/03/21 [id number]  CC: BPH/nocturia, hydroceles  HPI: I saw Mason Mueller in urology clinic today for the above issues.  Mason Mueller is a 79 year old African-American Mueller with sleep apnea only sometimes compliant with CPAP, obesity and BMI of 39, and hypertension who presents with mild urinary symptoms and bilateral hydroceles.  Mason Mueller is minimally bothered by either of these issues, but Mason Mueller wanted to "check on his prostate" today.  Mason Mueller has been on doxazosin long-term.  Mason Mueller gets up 2-3 times at night to urinate but it is minimally bothersome.  Mason Mueller denies any urinary complaints during the day.  Mason Mueller drinks primarily water during the day.  Mason Mueller does take his diuretic before bed.  Mason Mueller is poorly compliant with his CPAP machine.  Mason Mueller denies any gross hematuria, flank pain, history of UTI, or history of urinary retention.  Mason Mueller has had the hydroceles for a long time and reportedly had a scrotal ultrasound a few years ago that showed hydroceles, but this imaging is not available to me.  Mason Mueller is not bothered by the scrotal swelling.  Urinalysis is benign today with 0-5 WBCs, 0-2 RBCs, no bacteria, nitrite negative.  PVR is normal at 35 mL.  PSAs have been normal in the past.   PMH: Past Medical History:  Diagnosis Date  . COPD (chronic obstructive pulmonary disease) ([place])   . Diabetes mellitus without complication ([place])   . Hyperlipidemia   . Hypertension   . Hyperuricemia   . Morbid obesity ([place])   . Neutropenia ([place])   . Pure hypercholesterolemia   . Sleep apnea     Surgical History: Past Surgical History:  Procedure Laterality Date  . BACK SURGERY  2007  . COLONOSCOPY    . COLONOSCOPY WITH PROPOFOL N/A 07/23/2017   Procedure: COLONOSCOPY WITH PROPOFOL;  Surgeon: Manya Silvas, MD;  Location: [facility] ENDOSCOPY;  Service: Endoscopy;  Laterality: N/A;  . ESOPHAGOGASTRODUODENOSCOPY (EGD) WITH PROPOFOL N/A 07/23/2017   Procedure: ESOPHAGOGASTRODUODENOSCOPY (EGD) WITH PROPOFOL;   Surgeon: Manya Silvas, MD;  Location: [facility] ENDOSCOPY;  Service: Endoscopy;  Laterality: N/A;  . KNEE ARTHROSCOPY Bilateral     Family History: No family history on file.  Social History:  reports that Mason Mueller has never smoked. Mason Mueller has never used smokeless tobacco. Mason Mueller reports current alcohol use. Mason Mueller reports that Mason Mueller does not use drugs.  Physical Exam: There were no vitals taken for this visit.   Constitutional:  Alert and oriented, No acute distress. Cardiovascular: No clubbing, cyanosis, or edema. Respiratory: Normal respiratory effort, no increased work of breathing. GI: Abdomen is soft, nontender, nondistended, no abdominal masses GU: Uncircumcised phallus with patent meatus.  Moderate to large sized bilateral hydroceles, nontender DRE: 50 g, smooth, no nodules or masses  Laboratory Data: Reviewed, see HPI  Pertinent Imaging: See HPI  Assessment & Plan:   In summary, is a 79 year old Mueller with mild BPH symptoms well controlled on doxazosin and stable hydroceles that are minimally symptomatic.  We reviewed the AUA guidelines regarding PSA screening, and Mason Mueller is past the age of routine screening, additionally PSA and DRE have been normal.  Regarding his BPH and nocturia, I strongly recommended Mason Mueller be more compliant with his CPAP, secondary to a strong correlation between nocturia and poorly controlled sleep apnea.  I also recommended changing the timing of his diuretics to earlier in the day to prevent nocturia.  We also discussed behavioral strategies regarding fluid intake and sleep hygiene.  Finally, regarding his  hydroceles, if Mason Mueller is not bothered by these they do not require any intervention.  I recommended some fitting underwear.  Follow-up as needed Continue doxazosin No further PSA screening per AUA guidelines  I spent Mason total minutes on the day of the encounter including pre-visit review of the medical record, face-to-face time with the patient, and post visit ordering of  labs/imaging/tests.  Legrand Rams, MD 10/20/2019  [facility] [address], [address] [place], [place] [address] [phone]

## 2019-10-20 NOTE — Patient Instructions (Signed)
1. Use your CPAP machine as directed to decrease the amount you urinate overnight 2. Take your diuretic blood pressure pill earlier in the day to prevent increased urination overnight   Prostate Cancer Screening  Prostate cancer screening is a test that is done to check for the presence of prostate cancer in men. The prostate gland is a walnut-sized gland that is located below the bladder and in front of the rectum in males. The function of the prostate is to add fluid to semen during ejaculation. Prostate cancer is the second most common type of cancer in men. Who should have prostate cancer screening?  Screening recommendations vary based on age and other risk factors. Screening is recommended if:  You are older than age 72. If you are age 61-69, talk with your health care provider about your need for screening and how often screening should be done. Because most prostate cancers are slow growing and will not cause death, screening is generally reserved in this age group for men who have a 10-15-year life expectancy.  You are younger than age 64, and you have these risk factors: ? Being a black male or a male of African descent. ? Having a father, brother, or uncle who has been diagnosed with prostate cancer. The risk is higher if your family member's cancer occurred at an early age. Screening is not recommended if:  You are younger than age 31.  You are between the ages of 80 and 30 and you have no risk factors.  You are 85 years of age or older. At this age, the risks that screening can cause are greater than the benefits that it may provide. If you are at high risk for prostate cancer, your health care provider may recommend that you have screenings more often or that you start screening at a younger age. How is screening for prostate cancer done? The recommended prostate cancer screening test is a blood test called the prostate-specific antigen (PSA) test. PSA is a protein that is made  in the prostate. As you age, your prostate naturally produces more PSA. Abnormally high PSA levels may be caused by:  Prostate cancer.  An enlarged prostate that is not caused by cancer (benign prostatic hyperplasia, BPH). This condition is very common in older men.  A prostate gland infection (prostatitis). Depending on the PSA results, you may need more tests, such as:  A physical exam to check the size of your prostate gland.  Blood and imaging tests.  A procedure to remove tissue samples from your prostate gland for testing (biopsy). What are the benefits of prostate cancer screening?  Screening can help to identify cancer at an early stage, before symptoms start and when the cancer can be treated more easily.  There is a small chance that screening may lower your risk of dying from prostate cancer. The chance is small because prostate cancer is a slow-growing cancer, and most men with prostate cancer die from a different cause. What are the risks of prostate cancer screening? The main risk of prostate cancer screening is diagnosing and treating prostate cancer that would never have caused any symptoms or problems. This is called overdiagnosisand overtreatment. PSA screening cannot tell you if your PSA is high due to cancer or a different cause. A prostate biopsy is the only procedure to diagnose prostate cancer. Even the results of a biopsy may not tell you if your cancer needs to be treated. Slow-growing prostate cancer may not need any treatment  other than monitoring, so diagnosing and treating it may cause unnecessary stress or other side effects. A prostate biopsy may also cause:  Infection or fever.  A false negative. This is a result that shows that you do not have prostate cancer when you actually do have prostate cancer. Questions to ask your health care provider  When should I start prostate cancer screening?  What is my risk for prostate cancer?  How often do I need  screening?  What type of screening tests do I need?  How do I get my test results?  What do my results mean?  Do I need treatment? Where to find more information  The American Cancer Society: www.cancer.org  American Urological Association: www.auanet.org Contact a health care provider if:  You have difficulty urinating.  You have pain when you urinate or ejaculate.  You have blood in your urine or semen.  You have pain in your back or in the area of your prostate. Summary  Prostate cancer is a common type of cancer in men. The prostate gland is located below the bladder and in front of the rectum. This gland adds fluid to semen during ejaculation.  Prostate cancer screening may identify cancer at an early stage, when the cancer can be treated more easily.  The prostate-specific antigen (PSA) test is the recommended screening test for prostate cancer.  Discuss the risks and benefits of prostate cancer screening with your health care provider. If you are age 69 or older, the risks that screening can cause are greater than the benefits that it may provide. This information is not intended to replace advice given to you by your health care provider. Make sure you discuss any questions you have with your health care provider. Document Revised: 01/07/2019 Document Reviewed: 01/07/2019 Elsevier Patient Education  2020 Elsevier Inc.   Hydrocele, Adult A hydrocele is a collection of fluid in the loose pouch of skin that holds the testicles (scrotum). This may happen because:  The amount of fluid produced in the scrotum is not absorbed by the rest of the body.  Fluid from the abdomen fills the scrotum. Normally, the testicles develop in the abdomen then move (drop) into to the scrotum before birth. The tube that the testicles travel through usually closes after the testicles drop. If the tube does not close, fluid from the abdomen can fill the scrotum. This is less common in  adults. What are the causes? The cause of a hydrocele in adults is usually not known. However, it may be caused by:  An injury to the scrotum.  An infection (epididymitis).  Decreased blood flow to the scrotum.  Twisting of a testicle (testicular torsion).  A birth defect.  A tumor or cancer of the testicle. What are the signs or symptoms? A hydrocele feels like a water-filled balloon. It may also feel heavy. Other symptoms include:  Swelling of the scrotum. The swelling may decrease when you lie down. You may also notice more swelling at night than in the morning.  Swelling of the groin.  Mild discomfort in the scrotum.  Pain. This can develop if the hydrocele was caused by infection or twisting. The larger the hydrocele, the more likely you are to have pain. How is this diagnosed? This condition may be diagnosed based on:  Physical exam.  Medical history. You may also have other tests, including:  Imaging tests, such as ultrasound.  Blood or urine tests. How is this treated? Most hydroceles go away on  their own. If you have no discomfort or pain, your health care provider may suggest close monitoring of your condition (called watch and wait or watchful waiting) until the condition goes away or symptoms develop. If treatment is needed, it may include:  Treating an underlying condition. This may include using an antibiotic medicine to treat an infection.  Surgery to stop fluid from collecting in the scrotum.  Surgery to drain the fluid. Options include: ? Needle aspiration. A needle is used to drain fluid. However, the fluid buildup will come back quickly. ? Hydrocelectomy. For this procedure, an incision is made in the scrotum to remove the fluid sac. Follow these instructions at home:  Watch the hydrocele for any changes.  Take over-the-counter and prescription medicines only as told by your health care provider.  If you were prescribed an antibiotic medicine,  use it as told by your health care provider. Do not stop taking the antibiotic even if you start to feel better.  Keep all follow-up visits as told by your health care provider. This is important. Contact a health care provider if:  You notice any changes in the hydrocele.  The swelling in your scrotum or groin gets worse.  The hydrocele becomes red, firm, painful, or tender to the touch.  You have a fever. Get help right away if you:  Develop a lot of pain, or your pain becomes worse. Summary  A hydrocele is a collection of fluid in the loose pouch of skin that holds the testicles (scrotum).  Hydroceles can cause swelling, discomfort, and sometimes pain.  In adults, the cause of a hydrocele usually is not known. However, it is sometimes caused by an infection or a rotation and twisting of the scrotum.  Treatment is usually not needed. Hydroceles often go away on their own. If a hydrocele causes pain, treatment may be given to ease the pain. This information is not intended to replace advice given to you by your health care provider. Make sure you discuss any questions you have with your health care provider. Document Revised: 06/07/2017 Document Reviewed: 06/07/2017 Elsevier Patient Education  2020 [employer].

## 2019-10-21 LAB — URINALYSIS, COMPLETE
Bilirubin, UA: NEGATIVE
Glucose, UA: NEGATIVE
Ketones, UA: NEGATIVE
Leukocytes,UA: NEGATIVE
Nitrite, UA: NEGATIVE
Protein,UA: NEGATIVE
Specific Gravity, UA: 1.025 (ref 1.005–1.030)
Urobilinogen, Ur: 0.2 mg/dL (ref 0.2–1.0)
pH, UA: 5.5 (ref 5.0–7.5)

## 2019-10-21 LAB — MICROSCOPIC EXAMINATION
Bacteria, UA: NONE SEEN
Epithelial Cells (non renal): NONE SEEN /hpf (ref 0–10)

## 2020-01-31 ENCOUNTER — Other Ambulatory Visit: Payer: Medicare HMO

## 2022-09-21 NOTE — Discharge Instructions (Addendum)
Instructions after Total Knee Replacement   James P. Angie Fava., M.D.     Dept. of Orthopaedics & Sports Medicine  [facility]  [address]  [place], [place]  [address]  Phone: [phone]   Fax: [phone]    DIET: Drink plenty of non-alcoholic fluids. Resume your normal diet. Include foods high in fiber.  ACTIVITY:  You may use crutches or a walker with weight-bearing as tolerated, unless instructed otherwise. You may be weaned off of the walker or crutches by your Physical Therapist.  Do NOT place pillows under the knee. Anything placed under the knee could limit your ability to straighten the knee.   Continue doing gentle exercises. Exercising will reduce the pain and swelling, increase motion, and prevent muscle weakness.   Please continue to use the TED compression stockings for 6 weeks. You may remove the stockings at night, but should reapply them in the morning. Do not drive or operate any equipment until instructed.  WOUND CARE:  Continue to use the PolarCare or ice packs periodically to reduce pain and swelling. You may bathe or shower after the staples are removed at the first office visit following surgery. Leave the Aquacel dressing on for another 6 days - have physical therapy change this bandage to a honeycomb bandage after 6 days.  MEDICATIONS: You may resume your regular medications. Please take the pain medication as prescribed on the medication. Do not take pain medication on an empty stomach. You have been given a prescription for a blood thinner (Lovenox or Coumadin). Please take the medication as instructed. (NOTE: After completing a 2 week course of Lovenox, take one Enteric-coated aspirin twice a day. This along with elevation will help reduce the possibility of phlebitis in your operated leg.) Do not drive or drink alcoholic beverages when taking pain medications.  CALL THE OFFICE FOR: Temperature above 101 degrees Excessive bleeding or  drainage on the dressing. Excessive swelling, coldness, or paleness of the toes. Persistent nausea and vomiting.  FOLLOW-UP:  You should have an appointment to return to the office in 10-14 days after surgery. Arrangements have been made for continuation of Physical Therapy (either home therapy or outpatient therapy).     [facility] Department Directory         [email or web address]       [email or web address]          Cardiology  Appointments: [place] - [phone]  Endocrinology  Appointments: [place] [phone] [place] - [phone]  Gastroenterology  Appointments: [place] [phone] [place] - [phone]        General Surgery   Appointments: [facility]  Internal Medicine/Family Medicine  Appointments: [facility] [place] - [phone] [place] - [phone]  Metabolic and Weigh Loss Surgery  Appointments: [facility]        Neurology  Appointments: [place] [phone] [place] - [phone]  Neurosurgery  Appointments: [place]  Obstetrics & Gynecology  Appointments: [place] [phone] [place] - [phone]        Pediatrics  Appointments: [place] [place] [phone] [place] - [phone]  Physiatry  Appointments: [place] [phone]  Physical Therapy  Appointments: [place] - [phone]        Podiatry  Appointments: [place] [phone] [place] - [phone]  Pulmonology  Appointments: [place]  Rheumatology  Appointments: [place] [phone]        [place] Location: [facility]  [address] [place], [place]  [address]  [place] Location: [facility] [address] [place], [place]  [address]  [place] Location: [facility] [address] [place], [place]  [address]

## 2022-09-27 ENCOUNTER — Encounter
Admission: RE | Admit: 2022-09-27 | Discharge: 2022-09-27 | Disposition: A | Discharge status: Home / Self Care | Payer: Medicare HMO | Source: Ambulatory Visit | Attending: Orthopedic Surgery | Admitting: Orthopedic Surgery

## 2022-09-27 DIAGNOSIS — Z01812 Encounter for preprocedural laboratory examination: Secondary | ICD-10-CM

## 2022-09-27 DIAGNOSIS — E1169 Type 2 diabetes mellitus with other specified complication: Secondary | ICD-10-CM | POA: Diagnosis not present

## 2022-09-27 DIAGNOSIS — Z01818 Encounter for other preprocedural examination: Secondary | ICD-10-CM | POA: Insufficient documentation

## 2022-09-27 DIAGNOSIS — Z01810 Encounter for preprocedural cardiovascular examination: Secondary | ICD-10-CM | POA: Diagnosis not present

## 2022-09-27 DIAGNOSIS — D509 Iron deficiency anemia, unspecified: Secondary | ICD-10-CM | POA: Diagnosis not present

## 2022-09-27 DIAGNOSIS — M1712 Unilateral primary osteoarthritis, left knee: Secondary | ICD-10-CM

## 2022-09-27 HISTORY — DX: Male erectile dysfunction, unspecified: N52.9

## 2022-09-27 HISTORY — DX: Type 2 diabetes mellitus without complications: E11.9

## 2022-09-27 HISTORY — DX: Benign prostatic hyperplasia without lower urinary tract symptoms: N40.0

## 2022-09-27 HISTORY — DX: Benign neoplasm of colon, unspecified: D12.6

## 2022-09-27 HISTORY — DX: Iron deficiency anemia, unspecified: D50.9

## 2022-09-27 HISTORY — DX: Helicobacter pylori (H. pylori) as the cause of diseases classified elsewhere: B96.81

## 2022-09-27 HISTORY — DX: Other ill-defined heart diseases: I51.89

## 2022-09-27 HISTORY — DX: Hydrocele, unspecified: N43.3

## 2022-09-27 HISTORY — DX: Bilateral primary osteoarthritis of knee: M17.0

## 2022-09-27 LAB — CBC
HCT: 40.8 % (ref 39.0–52.0)
Hemoglobin: 12.6 g/dL — ABNORMAL LOW (ref 13.0–17.0)
MCH: 23.2 pg — ABNORMAL LOW (ref 26.0–34.0)
MCHC: 30.9 g/dL (ref 30.0–36.0)
MCV: 75.3 fL — ABNORMAL LOW (ref 80.0–100.0)
Platelets: 169 10*3/uL (ref 150–400)
RBC: 5.42 MIL/uL (ref 4.22–5.81)
RDW: 15.3 % (ref 11.5–15.5)
WBC: 3.3 10*3/uL — ABNORMAL LOW (ref 4.0–10.5)
nRBC: 0 % (ref 0.0–0.2)

## 2022-09-27 LAB — COMPREHENSIVE METABOLIC PANEL
ALT: 20 U/L (ref 0–44)
AST: 24 U/L (ref 15–41)
Albumin: 3.8 g/dL (ref 3.5–5.0)
Alkaline Phosphatase: 41 U/L (ref 38–126)
Anion gap: 7 (ref 5–15)
BUN: 16 mg/dL (ref 8–23)
CO2: 28 mmol/L (ref 22–32)
Calcium: 8.9 mg/dL (ref 8.9–10.3)
Chloride: 106 mmol/L (ref 98–111)
Creatinine, Ser: 0.86 mg/dL (ref 0.61–1.24)
GFR, Estimated: >60 mL/min (ref >60)
Glucose, Bld: 130 mg/dL — ABNORMAL HIGH (ref 70–99)
Potassium: 3.7 mmol/L (ref 3.5–5.1)
Sodium: 141 mmol/L (ref 135–145)
Total Bilirubin: 0.7 mg/dL (ref 0.3–1.2)
Total Protein: 7.1 g/dL (ref 6.5–8.1)

## 2022-09-27 LAB — URINALYSIS, ROUTINE W REFLEX MICROSCOPIC
Bilirubin Urine: NEGATIVE
Glucose, UA: NEGATIVE mg/dL
Hgb urine dipstick: NEGATIVE
Ketones, ur: NEGATIVE mg/dL
Leukocytes,Ua: NEGATIVE
Nitrite: NEGATIVE
Protein, ur: NEGATIVE mg/dL
Specific Gravity, Urine: 1.018 (ref 1.005–1.030)
pH: 5 (ref 5.0–8.0)

## 2022-09-27 LAB — SEDIMENTATION RATE: Sed Rate: 6 mm/hr (ref 0–20)

## 2022-09-27 LAB — TYPE AND SCREEN
ABO/RH(D): AB POS
Antibody Screen: NEGATIVE

## 2022-09-27 LAB — SURGICAL PCR SCREEN
MRSA, PCR: NEGATIVE
Staphylococcus aureus: NEGATIVE

## 2022-09-27 LAB — HEMOGLOBIN A1C
Hgb A1c MFr Bld: 6.3 % — ABNORMAL HIGH (ref 4.8–5.6)
Mean Plasma Glucose: 134.11 mg/dL

## 2022-09-27 LAB — C-REACTIVE PROTEIN: CRP: 0.8 mg/dL (ref <1.0)

## 2022-09-27 NOTE — Patient Instructions (Addendum)
Your procedure is scheduled on: Monday, April 29 Report to the Registration Desk on the 1st floor of the [facility] To find out your arrival time, please call [phone] between 1PM - 3PM on: Friday, April 26 If your arrival time is 6:00 am, do not arrive before that time as the [facility] entrance doors do not open until 6:00 am.  REMEMBER: Instructions that are not followed completely may result in serious medical risk, up to and including death; or upon the discretion of your surgeon and anesthesiologist your surgery may need to be rescheduled.  Do not eat food after midnight the night before surgery.  No gum chewing or hard candies.  You may however, drink water up to 2 hours before you are scheduled to arrive for your surgery. Do not drink anything within 2 hours of your scheduled arrival time.  In addition, your doctor has ordered for you to drink the provided:  Gatorade G2 Drinking this carbohydrate drink up to two hours before surgery helps to reduce insulin resistance and improve patient outcomes. Please complete drinking 2 hours before scheduled arrival time.  One week prior to surgery: starting April 22 Stop aspirin and Anti-inflammatories (NSAIDS) such as Advil, Aleve, Ibuprofen, Motrin, Naproxen, Naprosyn and Aspirin based products such as Excedrin, Goody's Powder, BC Powder. Stop ANY OVER THE COUNTER supplements until after surgery. You may however, continue to take Tylenol if needed for pain up until the day of surgery.  Continue taking all prescribed medications with the exception of the following:  TAKE ONLY THESE MEDICATIONS THE MORNING OF SURGERY  Symbicort inhaler  Use inhalers on the day of surgery and bring your albuterol inhaler to the hospital.  No Alcohol for 24 hours before or after surgery.  No Smoking including e-cigarettes for 24 hours before surgery.  No chewable tobacco products for at least 6 hours before surgery.  No nicotine patches on the  day of surgery.  Do not use any "recreational" drugs for at least a week (preferably 2 weeks) before your surgery.  Please be advised that the combination of cocaine and anesthesia may have negative outcomes, up to and including death. If you test positive for cocaine, your surgery will be cancelled.  On the morning of surgery brush your teeth with toothpaste and water, you may rinse your mouth with mouthwash if you wish. Do not swallow any toothpaste or mouthwash.  Use CHG Soap as directed on instruction sheet.  Do not wear jewelry.  Do not wear lotions, powders, or perfumes.   Do not shave body hair from the neck down 48 hours before surgery.  Contact lenses, hearing aids and dentures may not be worn into surgery.  Do not bring valuables to the hospital. [facility] is not responsible for any missing/lost belongings or valuables.   Notify your doctor if there is any change in your medical condition (cold, fever, infection).  Wear comfortable clothing (specific to your surgery type) to the hospital.  After surgery, you can help prevent lung complications by doing breathing exercises.  Take deep breaths and cough every 1-2 hours. Your doctor may order a device called an Incentive Spirometer to help you take deep breaths.  If you are being admitted to the hospital overnight, leave your suitcase in the car. After surgery it may be brought to your room.  In case of increased patient census, it may be necessary for you, the patient, to continue your postoperative care in the Same Day Surgery department.  If you are being discharged the day of surgery, you will not be allowed to drive home. You will need a responsible individual to drive you home and stay with you for 24 hours after surgery.   If you are taking public transportation, you will need to have a responsible individual with you.  Please call the Pre-admissions Testing Dept. at [phone] if you have any questions about  these instructions.  Surgery Visitation Policy:  Patients having surgery or a procedure may have two visitors.  Children under the age of 43 must have an adult with them who is not the patient.  Inpatient Visitation:    Visiting hours are 7 a.m. to 8 p.m. Up to four visitors are allowed at one time in a patient room. The visitors may rotate out with other people during the day.  One visitor age 68 or older may stay with the patient overnight and must be in the room by 8 p.m.  Preoperative Educational Videos for Total Hip, Knee and Shoulder Replacements  To better prepare for surgery, please view our videos that explain the physical activity and discharge planning required to have the best surgical recovery at [facility].  [email or web address]  Questions? Call [phone] or email [email or web address]

## 2022-10-03 LAB — IGE: IgE (Immunoglobulin E), Serum: 233 IU/mL (ref 6–495)

## 2022-10-06 ENCOUNTER — Encounter: Payer: Self-pay | Admitting: Orthopedic Surgery

## 2022-10-06 MED ORDER — GABAPENTIN 300 MG PO CAPS
300.0000 mg | ORAL_CAPSULE | Freq: Once | ORAL | Status: AC
  Administered 2022-10-07: 300 mg via ORAL

## 2022-10-06 MED ORDER — ORAL CARE MOUTH RINSE: 15.0000 mL | Freq: Once | OROMUCOSAL | Status: AC

## 2022-10-06 MED ORDER — DEXAMETHASONE SODIUM PHOSPHATE 10 MG/ML IJ SOLN
8.0000 mg | Freq: Once | INTRAMUSCULAR | Status: AC
  Administered 2022-10-07: 8 mg via INTRAVENOUS

## 2022-10-06 MED ORDER — CEFAZOLIN SODIUM-DEXTROSE 2-4 GM/100ML-% IV SOLN
2.0000 g | INTRAVENOUS | Status: AC
  Administered 2022-10-07: 2 g via INTRAVENOUS

## 2022-10-06 MED ORDER — SODIUM CHLORIDE 0.9 % IV SOLN: INTRAVENOUS | Status: DC

## 2022-10-06 MED ORDER — TRANEXAMIC ACID-NACL 1000-0.7 MG/100ML-% IV SOLN
1000.0000 mg | INTRAVENOUS | Status: AC
  Administered 2022-10-07: 1000 mg via INTRAVENOUS

## 2022-10-06 MED ORDER — FAMOTIDINE 20 MG PO TABS
20.0000 mg | ORAL_TABLET | Freq: Once | ORAL | Status: AC
  Administered 2022-10-07: 20 mg via ORAL

## 2022-10-06 MED ORDER — CHLORHEXIDINE GLUCONATE 0.12 % MT SOLN
15.0000 mL | Freq: Once | OROMUCOSAL | Status: AC
  Administered 2022-10-07: 15 mL via OROMUCOSAL

## 2022-10-06 MED ORDER — CELECOXIB 200 MG PO CAPS
400.0000 mg | ORAL_CAPSULE | Freq: Once | ORAL | Status: AC
  Administered 2022-10-07: 400 mg via ORAL

## 2022-10-06 MED ORDER — CHLORHEXIDINE GLUCONATE 4 % EX LIQD
60.0000 mL | Freq: Once | CUTANEOUS | Status: DC
  Filled 2022-10-06: qty 60

## 2022-10-06 NOTE — H&P (Signed)
ORTHOPAEDIC HISTORY & PHYSICAL Latanya Maudlin, PA - 10/02/2022 11:15 AM EDT Formatting of this note is different from the original. Images from the original note were not included. Chief Complaint Chief Complaint Patient presents with Knee Pain H & P LEFT KNEE  Reason for Visit Mason Mueller is a 82 y.o. who presents today for a history and physical. He is to undergo a left total knee arthroplasty on 10/07/2022. Since his last visit here to clinic there is been no improvement in his condition. The patient expresses his desire to proceed with surgery.  He has a long history of left knee pain. He localizes most of the pain along the medial aspect of the knee. He reports some swelling, no locking, and some giving way of the knee. The pain is aggravated by any weight bearing. The knee pain limits the patient's ability to ambulate long distances. The patient has not appreciated any significant improvement despite Tylenol, NSAIDs, intraarticular corticosteroid injections, viscosupplementation, and activity modification. He is not using any ambulatory aids. The patient states that the knee pain has progressed to the point that it is significantly interfering with his activities of daily living.  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 Helicobacter pylori gastritis 07/23/2017 On 07/23/2017 EGD-- treated History of adenomatous polyp of colon 06/16/2006 next colonoscopy due 07/2022 Hydrocele 03/08/2015 Bilateral, R>L Hypertension associated with type 2 diabetes mellitus (CMS/HHS-HCC) 03/02/2014 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 Iron deficiency anemia 09/29/2019 Mild left ventricular systolic  dysfunction Morbid obesity (CMS/HHS-HCC) 10/13/2016 With BMI 35.0-39.9, additional risk factors of hypertension, diabetes, OSA and hypercholesterolemia Neutropenia (CMS-HCC) 09/19/2015 Osteoarthritis of knees, 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 Pure hypercholesterolemia 03/02/2014 Sleep apnea 03/02/2014 intolerant to CPAP  Past Surgical History Past Surgical History: Procedure Laterality Date LAMINECTOMY LUMBAR SPINE 11/2001 L4-5 ARTHROSCOPY KNEE Right 2006 COLONOSCOPY 06/16/2006 Adenomatous Polyp ARTHROSCOPY KNEE Left 08/2006 COLONOSCOPY 10/14/2011 PH Adenomatous Polyp: CBF 10/2016; Recall Ltr mailed 08/29/2016 (dw) COLONOSCOPY 07/23/2017 PH Adenomatous Polyp: CBF 07/2022 EGD 07/23/2017 H Pylori: No repeat per RTE  Past Family History Family History Problem Relation Age of Onset Myocardial Infarction (Heart attack) Mother Kidney failure Father Colon cancer Brother Prostate cancer Brother High blood pressure (Hypertension) Sister Renal Insufficiency Sister High blood pressure (Hypertension) Sister High blood pressure (Hypertension) Sister Cancer Brother Breast cancer Neg Hx  Medications Current Outpatient Medications Medication Sig Dispense Refill acetaminophen (TYLENOL) 500 MG tablet Take 500 mg by mouth every 6 (six) hours as needed for Pain aspirin 81 MG EC tablet Take 81 mg by mouth once daily. budesonide-formoteroL (SYMBICORT) 160-4.5 mcg/actuation inhaler Inhale 2 inhalations into the lungs 2 (two) times daily 1 g 12 cyanocobalamin (VITAMIN B12) 500 MCG tablet Take 500 mcg by mouth once daily doxazosin (CARDURA) 8 MG tablet TAKE 1 TABLET BY MOUTH AT BEDTIME FOR BLOOD PRESSURE AND PROSTATE 90 tablet 3 ferrous sulfate 325 (65 FE) MG tablet TAKE 1 TABLET BY MOUTH ONCE DAILY WITH FOOD 90 tablet 3 hydroCHLOROthiazide (HYDRODIURIL) 12.5 MG tablet TAKE 1 TABLET BY MOUTH ONCE EVERY MORNING FOR BLOOD PRESSURE (HIGH) 90 tablet 3 latanoprost (XALATAN) 0.005 % ophthalmic solution Place 1 drop into  both eyes nightly lisinopriL (ZESTRIL) 40 MG tablet TAKE 1 TABLET BY MOUTH ONCE DAILY FOR BLOOD PRESSURE (HIGH) 90 tablet 3 simvastatin (ZOCOR) 40 MG tablet TAKE 1 TABLET BY MOUTH AT BEDTIME  90 tablet 3 cyanocobalamin (VITAMIN B12) 1000 MCG tablet Take 1,000 mcg by mouth once daily docusate (COLACE) 100 MG capsule Take 100 mg by mouth once daily as needed for Constipation (Patient not taking: Reported on 10/02/2022) modafiniL (PROVIGIL) 200 MG tablet Take 1 tablet (200 mg total) by mouth once daily (Patient taking differently: Take 50 mg by mouth once daily) 30 tablet 3  No current facility-administered medications for this visit.  Allergies Allergies Allergen Reactions Penicillins Itching and Rash Sulfa (Sulfonamide Antibiotics) Itching and Rash   Review of Systems A comprehensive 14 point ROS was performed, reviewed, and the pertinent orthopaedic findings are documented in the HPI.  Exam BP (!) 150/80 (BP Location: Left upper arm, Patient Position: Sitting, BP Cuff Size: Large Adult)  Ht 172.7 cm (5\' 8" )  Wt (!) 110.7 kg (244 lb)  BMI 37.10 kg/m  General: Well-developed well-nourished male seen in no acute distress.  HEENT: Atraumatic,normocephalic. Pupils are equal and reactive to light. Oropharynx is clear with moist mucosa  Lungs: Clear to auscultation bilaterally  Cardiovascular: Irregular rate and rhythm. Patient has had this all of his life and has been worked up. Normal S1, S2. No murmurs. No appreciable gallops or rubs. Peripheral pulses are palpable.  Abdomen: Soft, non-tender, nondistended. Bowel sounds present  Extremity: Left Knee: Soft tissue swelling: minimal Effusion: none Erythema: none Crepitance: mild Tenderness: medial Alignment: relative varus Mediolateral laxity: medial pseudolaxity Posterior sag: negative Patellar tracking: Good tracking without evidence of subluxation or tilt Atrophy: No significant atrophy. Quadriceps tone was good. Range  of motion: 0/0/125 degrees  Neurological:  The patient is alert and oriented Sensation to light touch appears to be intact and within normal limits Gross motor strength appeared to be equal to 5/5  Vascular :  Peripheral pulses felt to be palpable. Capillary refill appears to be intact and within normal limits  X-ray  1. AP standing, lateral and sunrise view of the left knee ordered and interpreted on today's visit shows bone-on-bone to the medial compartment space with subchondral sclerosis noted. I do not appreciate osteophyte formation to this region. Is noted to have increased varus deformity. Mild osteophyte formation noted to the patellofemoral articulation.  Impression  1. Degenerative arthrosis left knee  Plan  1. Patient medication was gone over on today's visit. He is already discontinued his aspirin 2. Past medical history reviewed 3. Postop rehab course discussed 4. Return to clinic 2 weeks postop. Sooner if any problems  This note was generated in part with voice recognition software and I apologize for any typographical errors that were not detected and corrected   Tera Partridge PA Electronically signed by Latanya Maudlin, PA at 10/02/2022 12:04 PM EDT

## 2022-10-07 ENCOUNTER — Ambulatory Visit: Payer: Medicare HMO | Admitting: Urgent Care

## 2022-10-07 ENCOUNTER — Observation Stay: Payer: Medicare HMO

## 2022-10-07 ENCOUNTER — Other Ambulatory Visit: Payer: Self-pay

## 2022-10-07 ENCOUNTER — Ambulatory Visit: Payer: Medicare HMO | Admitting: General Practice

## 2022-10-07 ENCOUNTER — Encounter: Payer: Self-pay | Admitting: Orthopedic Surgery

## 2022-10-07 ENCOUNTER — Observation Stay
Admission: RE | Admit: 2022-10-07 | Discharge: 2022-10-08 | Disposition: A | Discharge status: Home / Self Care | Payer: Medicare HMO | Attending: Orthopedic Surgery | Admitting: Orthopedic Surgery

## 2022-10-07 ENCOUNTER — Encounter
Admission: RE | Disposition: A | Discharge status: Home / Self Care | Payer: Self-pay | Source: Home / Self Care | Attending: Orthopedic Surgery

## 2022-10-07 DIAGNOSIS — J449 Chronic obstructive pulmonary disease, unspecified: Secondary | ICD-10-CM | POA: Insufficient documentation

## 2022-10-07 DIAGNOSIS — Z7982 Long term (current) use of aspirin: Secondary | ICD-10-CM | POA: Insufficient documentation

## 2022-10-07 DIAGNOSIS — D509 Iron deficiency anemia, unspecified: Secondary | ICD-10-CM

## 2022-10-07 DIAGNOSIS — Z79899 Other long term (current) drug therapy: Secondary | ICD-10-CM | POA: Insufficient documentation

## 2022-10-07 DIAGNOSIS — M1712 Unilateral primary osteoarthritis, left knee: Principal | ICD-10-CM

## 2022-10-07 DIAGNOSIS — E1169 Type 2 diabetes mellitus with other specified complication: Secondary | ICD-10-CM

## 2022-10-07 DIAGNOSIS — E119 Type 2 diabetes mellitus without complications: Secondary | ICD-10-CM | POA: Insufficient documentation

## 2022-10-07 DIAGNOSIS — Z96659 Presence of unspecified artificial knee joint: Secondary | ICD-10-CM

## 2022-10-07 DIAGNOSIS — Z01812 Encounter for preprocedural laboratory examination: Secondary | ICD-10-CM

## 2022-10-07 DIAGNOSIS — I10 Essential (primary) hypertension: Secondary | ICD-10-CM | POA: Insufficient documentation

## 2022-10-07 HISTORY — PX: KNEE ARTHROPLASTY: SHX992

## 2022-10-07 LAB — GLUCOSE, CAPILLARY
Glucose-Capillary: 105 mg/dL — ABNORMAL HIGH (ref 70–99)
Glucose-Capillary: 136 mg/dL — ABNORMAL HIGH (ref 70–99)
Glucose-Capillary: 186 mg/dL — ABNORMAL HIGH (ref 70–99)
Glucose-Capillary: 122 mg/dL — ABNORMAL HIGH (ref 70–99)

## 2022-10-07 LAB — ABO/RH: ABO/RH(D): AB POS

## 2022-10-07 SURGERY — ARTHROPLASTY, KNEE, TOTAL, USING IMAGELESS COMPUTER-ASSISTED NAVIGATION
Anesthesia: Spinal | Site: Knee | Laterality: Left

## 2022-10-07 MED ORDER — SENNOSIDES-DOCUSATE SODIUM 8.6-50 MG PO TABS
1.0000 | ORAL_TABLET | Freq: Two times a day (BID) | ORAL | Status: DC
  Administered 2022-10-07 – 2022-10-08 (×3): 1 via ORAL

## 2022-10-07 MED ORDER — CEFAZOLIN SODIUM-DEXTROSE 2-4 GM/100ML-% IV SOLN
2.0000 g | Freq: Four times a day (QID) | INTRAVENOUS | Status: AC
  Administered 2022-10-07 (×2): 2 g via INTRAVENOUS

## 2022-10-07 MED ORDER — INSULIN ASPART 100 UNIT/ML IJ SOLN
0.0000 [IU] | Freq: Three times a day (TID) | INTRAMUSCULAR | Status: DC
  Administered 2022-10-07: 3 [IU] via SUBCUTANEOUS

## 2022-10-07 MED ORDER — MOMETASONE FURO-FORMOTEROL FUM 200-5 MCG/ACT IN AERO: 2.0000 | INHALATION_SPRAY | Freq: Two times a day (BID) | RESPIRATORY_TRACT | Status: DC

## 2022-10-07 MED ORDER — GABAPENTIN 300 MG PO CAPS
ORAL_CAPSULE | ORAL | Status: AC
  Filled 2022-10-07: qty 1

## 2022-10-07 MED ORDER — ACETAMINOPHEN 325 MG PO TABS: 325.0000 mg | ORAL_TABLET | Freq: Four times a day (QID) | ORAL | Status: DC | PRN

## 2022-10-07 MED ORDER — PANTOPRAZOLE SODIUM 40 MG PO TBEC
DELAYED_RELEASE_TABLET | ORAL | Status: AC
  Filled 2022-10-07: qty 1

## 2022-10-07 MED ORDER — HYDROMORPHONE HCL 1 MG/ML IJ SOLN: 0.5000 mg | INTRAMUSCULAR | Status: DC | PRN

## 2022-10-07 MED ORDER — DIPHENHYDRAMINE HCL 12.5 MG/5ML PO ELIX: 12.5000 mg | ORAL_SOLUTION | ORAL | Status: DC | PRN

## 2022-10-07 MED ORDER — PROPOFOL 500 MG/50ML IV EMUL
INTRAVENOUS | Status: DC | PRN
  Administered 2022-10-07: 35 ug/kg/min via INTRAVENOUS

## 2022-10-07 MED ORDER — LIDOCAINE HCL URETHRAL/MUCOSAL 2 % EX GEL
CUTANEOUS | Status: AC
  Filled 2022-10-07: qty 6

## 2022-10-07 MED ORDER — BUPIVACAINE HCL 0.25 % IJ SOLN
INTRAMUSCULAR | Status: DC | PRN
  Administered 2022-10-07: 60 mL

## 2022-10-07 MED ORDER — LISINOPRIL 20 MG PO TABS
ORAL_TABLET | ORAL | Status: AC
  Filled 2022-10-07: qty 2

## 2022-10-07 MED ORDER — METOCLOPRAMIDE HCL 10 MG PO TABS
10.0000 mg | ORAL_TABLET | Freq: Three times a day (TID) | ORAL | Status: DC
  Administered 2022-10-07 – 2022-10-08 (×3): 10 mg via ORAL

## 2022-10-07 MED ORDER — OXYCODONE HCL 5 MG PO TABS
5.0000 mg | ORAL_TABLET | ORAL | Status: DC | PRN
  Administered 2022-10-07: 5 mg via ORAL

## 2022-10-07 MED ORDER — TRANEXAMIC ACID-NACL 1000-0.7 MG/100ML-% IV SOLN
1000.0000 mg | Freq: Once | INTRAVENOUS | Status: AC
  Administered 2022-10-07: 1000 mg via INTRAVENOUS

## 2022-10-07 MED ORDER — FAMOTIDINE 20 MG PO TABS
ORAL_TABLET | ORAL | Status: AC
  Filled 2022-10-07: qty 1

## 2022-10-07 MED ORDER — FLEET ENEMA 7-19 GM/118ML RE ENEM: 1.0000 | ENEMA | Freq: Once | RECTAL | Status: DC | PRN

## 2022-10-07 MED ORDER — OXYCODONE HCL 5 MG PO TABS
ORAL_TABLET | ORAL | Status: AC
  Filled 2022-10-07: qty 1

## 2022-10-07 MED ORDER — METOCLOPRAMIDE HCL 10 MG PO TABS
ORAL_TABLET | ORAL | Status: AC
  Filled 2022-10-07: qty 1

## 2022-10-07 MED ORDER — MIDAZOLAM HCL 2 MG/2ML IJ SOLN
INTRAMUSCULAR | Status: AC
  Filled 2022-10-07: qty 2

## 2022-10-07 MED ORDER — MENTHOL 3 MG MT LOZG: 1.0000 | LOZENGE | OROMUCOSAL | Status: DC | PRN

## 2022-10-07 MED ORDER — BUPIVACAINE HCL (PF) 0.5 % IJ SOLN
INTRAMUSCULAR | Status: AC
  Filled 2022-10-07: qty 10

## 2022-10-07 MED ORDER — SODIUM CHLORIDE 0.9 % IR SOLN
Status: DC | PRN
  Administered 2022-10-07: 3000 mL

## 2022-10-07 MED ORDER — ACETAMINOPHEN 10 MG/ML IV SOLN
INTRAVENOUS | Status: AC
  Filled 2022-10-07: qty 100

## 2022-10-07 MED ORDER — CELECOXIB 200 MG PO CAPS
200.0000 mg | ORAL_CAPSULE | Freq: Two times a day (BID) | ORAL | Status: DC
  Administered 2022-10-08: 200 mg via ORAL

## 2022-10-07 MED ORDER — ORAL CARE MOUTH RINSE: 15.0000 mL | OROMUCOSAL | Status: DC | PRN

## 2022-10-07 MED ORDER — HYDROCHLOROTHIAZIDE 25 MG PO TABS
12.5000 mg | ORAL_TABLET | Freq: Every day | ORAL | Status: DC
  Administered 2022-10-07 – 2022-10-08 (×2): 12.5 mg via ORAL

## 2022-10-07 MED ORDER — INSULIN ASPART 100 UNIT/ML IJ SOLN
0.0000 [IU] | Freq: Every day | INTRAMUSCULAR | Status: DC
  Administered 2022-10-07: 2 [IU] via SUBCUTANEOUS

## 2022-10-07 MED ORDER — SODIUM CHLORIDE 0.9 % IV SOLN
INTRAVENOUS | Status: DC | PRN
  Administered 2022-10-07: 60 mL

## 2022-10-07 MED ORDER — BUPIVACAINE HCL (PF) 0.25 % IJ SOLN
INTRAMUSCULAR | Status: AC
  Filled 2022-10-07: qty 60

## 2022-10-07 MED ORDER — KETAMINE HCL 50 MG/5ML IJ SOSY
PREFILLED_SYRINGE | INTRAMUSCULAR | Status: AC
  Filled 2022-10-07: qty 5

## 2022-10-07 MED ORDER — CEFAZOLIN SODIUM-DEXTROSE 2-4 GM/100ML-% IV SOLN
INTRAVENOUS | Status: AC
  Filled 2022-10-07: qty 100

## 2022-10-07 MED ORDER — LISINOPRIL 20 MG PO TABS
40.0000 mg | ORAL_TABLET | Freq: Every day | ORAL | Status: DC
  Administered 2022-10-07 – 2022-10-08 (×2): 40 mg via ORAL

## 2022-10-07 MED ORDER — ONDANSETRON HCL 4 MG/2ML IJ SOLN
INTRAMUSCULAR | Status: DC | PRN
  Administered 2022-10-07: 4 mg via INTRAVENOUS

## 2022-10-07 MED ORDER — ENOXAPARIN SODIUM 30 MG/0.3ML IJ SOSY
30.0000 mg | PREFILLED_SYRINGE | Freq: Two times a day (BID) | INTRAMUSCULAR | Status: DC
  Administered 2022-10-08: 30 mg via SUBCUTANEOUS

## 2022-10-07 MED ORDER — FENTANYL CITRATE (PF) 100 MCG/2ML IJ SOLN: 25.0000 ug | INTRAMUSCULAR | Status: DC | PRN

## 2022-10-07 MED ORDER — DEXAMETHASONE SODIUM PHOSPHATE 10 MG/ML IJ SOLN
INTRAMUSCULAR | Status: AC
  Filled 2022-10-07: qty 1

## 2022-10-07 MED ORDER — CHLORHEXIDINE GLUCONATE 0.12 % MT SOLN
OROMUCOSAL | Status: AC
  Filled 2022-10-07: qty 15

## 2022-10-07 MED ORDER — MAGNESIUM HYDROXIDE 400 MG/5ML PO SUSP
ORAL | Status: AC
  Filled 2022-10-07: qty 30

## 2022-10-07 MED ORDER — PHENOL 1.4 % MT LIQD: 1.0000 | OROMUCOSAL | Status: DC | PRN

## 2022-10-07 MED ORDER — GLYCOPYRROLATE 0.2 MG/ML IJ SOLN
INTRAMUSCULAR | Status: AC
  Filled 2022-10-07: qty 1

## 2022-10-07 MED ORDER — SURGIRINSE WOUND IRRIGATION SYSTEM - OPTIME
TOPICAL | Status: DC | PRN
  Administered 2022-10-07: 450 mL via TOPICAL

## 2022-10-07 MED ORDER — OXYCODONE HCL 5 MG PO TABS: 10.0000 mg | ORAL_TABLET | ORAL | Status: DC | PRN

## 2022-10-07 MED ORDER — GLYCOPYRROLATE 0.2 MG/ML IJ SOLN
INTRAMUSCULAR | Status: DC | PRN
  Administered 2022-10-07: .2 mg via INTRAVENOUS

## 2022-10-07 MED ORDER — MAGNESIUM HYDROXIDE 400 MG/5ML PO SUSP
30.0000 mL | Freq: Every day | ORAL | Status: DC
  Administered 2022-10-07 – 2022-10-08 (×2): 30 mL via ORAL

## 2022-10-07 MED ORDER — SENNOSIDES-DOCUSATE SODIUM 8.6-50 MG PO TABS
ORAL_TABLET | ORAL | Status: AC
  Filled 2022-10-07: qty 1

## 2022-10-07 MED ORDER — FENTANYL CITRATE (PF) 100 MCG/2ML IJ SOLN
INTRAMUSCULAR | Status: AC
  Filled 2022-10-07: qty 2

## 2022-10-07 MED ORDER — MIDAZOLAM HCL 5 MG/5ML IJ SOLN
INTRAMUSCULAR | Status: DC | PRN
  Administered 2022-10-07 (×2): 1 mg via INTRAVENOUS

## 2022-10-07 MED ORDER — ONDANSETRON HCL 4 MG/2ML IJ SOLN: 4.0000 mg | Freq: Four times a day (QID) | INTRAMUSCULAR | Status: DC | PRN

## 2022-10-07 MED ORDER — BUPIVACAINE LIPOSOME 1.3 % IJ SUSP
INTRAMUSCULAR | Status: AC
  Filled 2022-10-07: qty 20

## 2022-10-07 MED ORDER — CELECOXIB 200 MG PO CAPS
ORAL_CAPSULE | ORAL | Status: AC
  Filled 2022-10-07: qty 2

## 2022-10-07 MED ORDER — INSULIN ASPART 100 UNIT/ML IJ SOLN
INTRAMUSCULAR | Status: AC
  Filled 2022-10-07: qty 1

## 2022-10-07 MED ORDER — OXYCODONE HCL 5 MG/5ML PO SOLN: 5.0000 mg | Freq: Once | ORAL | Status: DC | PRN

## 2022-10-07 MED ORDER — TRAMADOL HCL 50 MG PO TABS
50.0000 mg | ORAL_TABLET | ORAL | Status: DC | PRN
  Administered 2022-10-07 – 2022-10-08 (×2): 50 mg via ORAL

## 2022-10-07 MED ORDER — HYDROCHLOROTHIAZIDE 25 MG PO TABS
ORAL_TABLET | ORAL | Status: AC
  Filled 2022-10-07: qty 1

## 2022-10-07 MED ORDER — DOXAZOSIN MESYLATE 4 MG PO TABS
8.0000 mg | ORAL_TABLET | Freq: Every day | ORAL | Status: DC
  Administered 2022-10-07: 8 mg via ORAL
  Filled 2022-10-07 (×2): qty 1
  Filled 2022-10-07: qty 2

## 2022-10-07 MED ORDER — PANTOPRAZOLE SODIUM 40 MG PO TBEC
40.0000 mg | DELAYED_RELEASE_TABLET | Freq: Two times a day (BID) | ORAL | Status: DC
  Administered 2022-10-07 – 2022-10-08 (×3): 40 mg via ORAL

## 2022-10-07 MED ORDER — 0.9 % SODIUM CHLORIDE (POUR BTL) OPTIME
TOPICAL | Status: DC | PRN
  Administered 2022-10-07: 1000 mL

## 2022-10-07 MED ORDER — BUPIVACAINE HCL (PF) 0.5 % IJ SOLN
INTRAMUSCULAR | Status: DC | PRN
  Administered 2022-10-07: 3 mL via INTRATHECAL

## 2022-10-07 MED ORDER — SODIUM CHLORIDE FLUSH 0.9 % IV SOLN
INTRAVENOUS | Status: AC
  Filled 2022-10-07: qty 40

## 2022-10-07 MED ORDER — ACETAMINOPHEN 10 MG/ML IV SOLN
INTRAVENOUS | Status: DC | PRN
  Administered 2022-10-07: 1000 mg via INTRAVENOUS

## 2022-10-07 MED ORDER — TRANEXAMIC ACID-NACL 1000-0.7 MG/100ML-% IV SOLN
INTRAVENOUS | Status: AC
  Filled 2022-10-07: qty 100

## 2022-10-07 MED ORDER — BISACODYL 10 MG RE SUPP: 10.0000 mg | Freq: Every day | RECTAL | Status: DC | PRN

## 2022-10-07 MED ORDER — ONDANSETRON HCL 4 MG/2ML IJ SOLN
INTRAMUSCULAR | Status: AC
  Filled 2022-10-07: qty 2

## 2022-10-07 MED ORDER — SIMVASTATIN 40 MG PO TABS
40.0000 mg | ORAL_TABLET | Freq: Every day | ORAL | Status: DC
  Administered 2022-10-07: 40 mg via ORAL
  Filled 2022-10-07: qty 1

## 2022-10-07 MED ORDER — OXYCODONE HCL 5 MG PO TABS: 5.0000 mg | ORAL_TABLET | Freq: Once | ORAL | Status: DC | PRN

## 2022-10-07 MED ORDER — FENTANYL CITRATE (PF) 100 MCG/2ML IJ SOLN
INTRAMUSCULAR | Status: DC | PRN
  Administered 2022-10-07 (×2): 50 ug via INTRAVENOUS

## 2022-10-07 MED ORDER — PROPOFOL 1000 MG/100ML IV EMUL
INTRAVENOUS | Status: AC
  Filled 2022-10-07: qty 100

## 2022-10-07 MED ORDER — LATANOPROST 0.005 % OP SOLN: 1.0000 [drp] | Freq: Every day | OPHTHALMIC | Status: DC

## 2022-10-07 MED ORDER — SODIUM CHLORIDE 0.9 % IV SOLN: INTRAVENOUS | Status: DC

## 2022-10-07 MED ORDER — KETAMINE HCL 50 MG/ML IJ SOLN
INTRAMUSCULAR | Status: DC | PRN
  Administered 2022-10-07: 10 mg via INTRAVENOUS
  Administered 2022-10-07: 15 mg via INTRAVENOUS
  Administered 2022-10-07: 25 mg via INTRAVENOUS

## 2022-10-07 MED ORDER — TRAMADOL HCL 50 MG PO TABS
ORAL_TABLET | ORAL | Status: AC
  Filled 2022-10-07: qty 1

## 2022-10-07 MED ORDER — MODAFINIL 100 MG PO TABS
100.0000 mg | ORAL_TABLET | Freq: Every day | ORAL | Status: DC
  Filled 2022-10-07 (×2): qty 1

## 2022-10-07 MED ORDER — ALUM & MAG HYDROXIDE-SIMETH 200-200-20 MG/5ML PO SUSP: 30.0000 mL | ORAL | Status: DC | PRN

## 2022-10-07 MED ORDER — ACETAMINOPHEN 10 MG/ML IV SOLN
1000.0000 mg | Freq: Four times a day (QID) | INTRAVENOUS | Status: DC
  Administered 2022-10-07 – 2022-10-08 (×3): 1000 mg via INTRAVENOUS

## 2022-10-07 MED ORDER — ONDANSETRON HCL 4 MG PO TABS: 4.0000 mg | ORAL_TABLET | Freq: Four times a day (QID) | ORAL | Status: DC | PRN

## 2022-10-07 SURGICAL SUPPLY — 74 items
ATTUNE MED DOME PAT 41 KNEE (Knees) IMPLANT
ATTUNE PS FEM LT SZ 7 CEM KNEE (Femur) IMPLANT
ATTUNE PSRP INSR SZ7 5 KNEE (Insert) IMPLANT
BASE TIBIAL ROT PLAT SZ 8 KNEE (Knees) IMPLANT
BATTERY INSTRU NAVIGATION (MISCELLANEOUS) ×8 IMPLANT
BLADE SAW 70X12.5 (BLADE) ×2 IMPLANT
BLADE SAW 90X13X1.19 OSCILLAT (BLADE) ×2 IMPLANT
BLADE SAW 90X25X1.19 OSCILLAT (BLADE) ×2 IMPLANT
BONE CEMENT GENTAMICIN (Cement) ×2 IMPLANT
BSPLAT TIB 8 CMNT ROT PLAT STR (Knees) ×1 IMPLANT
BTRY SRG DRVR LF (MISCELLANEOUS) ×4
CEMENT BONE GENTAMICIN 40 (Cement) IMPLANT
COOLER POLAR GLACIER W/PUMP (MISCELLANEOUS) ×2 IMPLANT
CUFF TOURN SGL QUICK 24 (TOURNIQUET CUFF)
CUFF TOURN SGL QUICK 34 (TOURNIQUET CUFF)
CUFF TRNQT CYL 24X4X16.5-23 (TOURNIQUET CUFF) IMPLANT
CUFF TRNQT CYL 34X4.125X (TOURNIQUET CUFF) IMPLANT
DRAPE 3/4 80X56 (DRAPES) ×2 IMPLANT
DRAPE INCISE IOBAN 66X45 STRL (DRAPES) IMPLANT
DRSG AQUACEL AG ADV 3.5X14 (GAUZE/BANDAGES/DRESSINGS) ×2 IMPLANT
DRSG DERMACEA 8X12 NADH (GAUZE/BANDAGES/DRESSINGS) IMPLANT
DRSG DERMACEA NONADH 3X8 (GAUZE/BANDAGES/DRESSINGS) ×2 IMPLANT
DRSG MEPILEX SACRM 8.7X9.8 (GAUZE/BANDAGES/DRESSINGS) ×2 IMPLANT
DRSG TEGADERM 4X4.75 (GAUZE/BANDAGES/DRESSINGS) ×2 IMPLANT
DURAPREP 26ML APPLICATOR (WOUND CARE) ×4 IMPLANT
ELECT CAUTERY BLADE 6.4 (BLADE) ×2 IMPLANT
ELECT REM PT RETURN 9FT ADLT (ELECTROSURGICAL) ×1
ELECTRODE REM PT RTRN 9FT ADLT (ELECTROSURGICAL) ×2 IMPLANT
EX-PIN ORTHOLOCK NAV 4X150 (PIN) ×4 IMPLANT
GLOVE BIOGEL M 8.0 STRL (GLOVE) IMPLANT
GLOVE BIOGEL M STRL SZ7.5 (GLOVE) ×4 IMPLANT
GLOVE SRG 8 PF TXTR STRL LF DI (GLOVE) ×2 IMPLANT
GLOVE SURG UNDER POLY LF SZ8 (GLOVE) ×2
GOWN STRL REUS W/ TWL LRG LVL3 (GOWN DISPOSABLE) ×2 IMPLANT
GOWN STRL REUS W/TWL LRG LVL3 (GOWN DISPOSABLE) ×1
GOWN TOGA ZIPPER T7+ PEEL AWAY (MISCELLANEOUS) ×2 IMPLANT
HANDLE YANKAUER SUCT OPEN TIP (MISCELLANEOUS) ×2 IMPLANT
HEMOVAC 400CC 10FR (MISCELLANEOUS) ×2 IMPLANT
HOLDER FOLEY CATH W/STRAP (MISCELLANEOUS) ×2 IMPLANT
IV NS IRRIG 3000ML ARTHROMATIC (IV SOLUTION) ×2 IMPLANT
KIT TURNOVER KIT A (KITS) ×2 IMPLANT
KNIFE SCULPS 14X20 (INSTRUMENTS) ×2 IMPLANT
MANIFOLD NEPTUNE II (INSTRUMENTS) ×4 IMPLANT
NDL SPNL 20GX3.5 QUINCKE YW (NEEDLE) ×4 IMPLANT
NEEDLE SPNL 20GX3.5 QUINCKE YW (NEEDLE) ×2 IMPLANT
NS IRRIG 500ML POUR BTL (IV SOLUTION) ×2 IMPLANT
PACK TOTAL KNEE (MISCELLANEOUS) ×2 IMPLANT
PAD ABD DERMACEA PRESS 5X9 (GAUZE/BANDAGES/DRESSINGS) ×4 IMPLANT
PAD WRAPON POLAR KNEE (MISCELLANEOUS) ×2 IMPLANT
PIN DRILL FIX HALF THREAD (BIT) ×4 IMPLANT
PIN DRILL QUICK PACK ×4 IMPLANT
PIN FIXATION 1/8DIA X 3INL (PIN) ×2 IMPLANT
PULSAVAC PLUS IRRIG FAN TIP (DISPOSABLE) ×1
SOL PREP PVP 2OZ (MISCELLANEOUS) ×1
SOLUTION IRRIG SURGIPHOR (IV SOLUTION) ×2 IMPLANT
SOLUTION PREP PVP 2OZ (MISCELLANEOUS) ×2 IMPLANT
SPONGE DRAIN TRACH 4X4 STRL 2S (GAUZE/BANDAGES/DRESSINGS) ×2 IMPLANT
STAPLER SKIN PROX 35W (STAPLE) ×2 IMPLANT
STOCKINETTE IMPERV 14X48 (MISCELLANEOUS) ×2 IMPLANT
STRAP TIBIA SHORT (MISCELLANEOUS) ×2 IMPLANT
SUCTION FRAZIER HANDLE 10FR (MISCELLANEOUS) ×1
SUCTION TUBE FRAZIER 10FR DISP (MISCELLANEOUS) ×2 IMPLANT
SUT VIC AB 0 CT1 36 (SUTURE) ×2 IMPLANT
SUT VIC AB 1 CT1 36 (SUTURE) ×4 IMPLANT
SUT VIC AB 2-0 CT2 27 (SUTURE) ×2 IMPLANT
SYR 30ML LL (SYRINGE) ×4 IMPLANT
TIBIAL BASE ROT PLAT SZ 8 KNEE (Knees) ×1 IMPLANT
TIP FAN IRRIG PULSAVAC PLUS (DISPOSABLE) ×2 IMPLANT
TOWEL OR 17X26 4PK STRL BLUE (TOWEL DISPOSABLE) IMPLANT
TOWER CARTRIDGE SMART MIX (DISPOSABLE) ×2 IMPLANT
TRAP FLUID SMOKE EVACUATOR (MISCELLANEOUS) ×2 IMPLANT
TRAY FOLEY MTR SLVR 16FR STAT (SET/KITS/TRAYS/PACK) ×2 IMPLANT
WATER STERILE IRR 1000ML POUR (IV SOLUTION) ×2 IMPLANT
WRAPON POLAR PAD KNEE (MISCELLANEOUS) ×1

## 2022-10-07 NOTE — Evaluation (Signed)
Occupational Therapy Evaluation Patient Details Name: Mason Mueller MRN: [id number] DOB: [birth year] Today's Date: 10/07/2022   History of Present Illness Patient is a 82 year old male with degenerative arthrosis of the left knee s/p L total knee arthroplasty.   Clinical Impression   Pt seen for OT evaluation this date, POD#1 from above surgery. Pt was independent in all ADL prior to surgery and is eager to return to PLOF with less pain and improved safety and independence. Pt currently requires PRN minimal assist for LB ADL while in seated position due to pain and limited AROM of L knee. Pt/family instructed in polar care mgt, falls prevention strategies, home/routines modifications, DME/AE for LB bathing and dressing tasks, and compression stocking mgt. Pt would benefit from skilled OT services while hospitalized including additional instruction in dressing techniques with or without assistive devices for dressing and bathing skills to support recall and carryover prior to discharge and ultimately to maximize safety, independence, and minimize falls risk and caregiver burden. Do not currently anticipate any OT needs following this hospitalization.      Recommendations for follow up therapy are one component of a multi-disciplinary discharge planning process, led by the attending physician.  Recommendations may be updated based on patient status, additional functional criteria and insurance authorization.   Assistance Recommended at Discharge PRN  Patient can return home with the following A little help with walking and/or transfers;A little help with bathing/dressing/bathroom;Assist for transportation;Help with stairs or ramp for entrance    Functional Status Assessment  Patient has had a recent decline in their functional status and demonstrates the ability to make significant improvements in function in a reasonable and predictable amount of time.  Equipment Recommendations  None recommended  by OT    Recommendations for Other Services       Precautions / Restrictions Precautions Precautions: Knee;Fall Precaution Booklet Issued: Yes (comment) Required Braces or Orthoses:  (KI if unable to SLR) Restrictions Weight Bearing Restrictions: Yes LLE Weight Bearing: Weight bearing as tolerated      Mobility Bed Mobility               General bed mobility comments: NT, in recliner    Transfers Overall transfer level: Needs assistance Equipment used: Rolling walker (2 wheels) Transfers: Sit to/from Stand Sit to Stand: Min guard                  Balance Overall balance assessment: Needs assistance Sitting-balance support: Feet supported Sitting balance-Leahy Scale: Good     Standing balance support: Bilateral upper extremity supported Standing balance-Leahy Scale: Fair                             ADL either performed or assessed with clinical judgement   ADL                                         General ADL Comments: Pt requires PRN MIN A for LB ADL tasks, CGA for ADL transfers with RW. Spouse able to provide needed level of assist.     Vision         Perception     Praxis      Pertinent Vitals/Pain Pain Assessment Pain Assessment: 0-10 Pain Score: 5  Pain Location: L knee Pain Descriptors / Indicators: Discomfort, Sore Pain Intervention(s): Limited activity within patient's  tolerance, Monitored during session, Premedicated before session, Repositioned, Ice applied     Hand Dominance     Extremity/Trunk Assessment Upper Extremity Assessment Upper Extremity Assessment: Overall WFL for tasks assessed   Lower Extremity Assessment Lower Extremity Assessment: LLE deficits/detail LLE Deficits / Details: expected post-op deficits, indep with SLR, no KI LLE Sensation: WNL       Communication Communication Communication: No difficulties   Cognition Arousal/Alertness: Awake/alert Behavior During Therapy:  WFL for tasks assessed/performed Overall Cognitive Status: Within Functional Limits for tasks assessed                                       General Comments       Exercises Other Exercises Other Exercises: Pt/spouse educated in home/routines modifications, compression stocking mgt, polar care mgt, AE/DME, and falls prevention. Handout provided.   Shoulder Instructions      Home Living Family/patient expects to be discharged to:: Private residence Living Arrangements: Spouse/significant other Available Help at Discharge: Family Type of Home: House Home Access: Stairs to enter [employer] of Steps: 2 Entrance Stairs-Rails:  (garage has no rail, front steps with rail) Home Layout: One level     Bathroom Shower/Tub: Chief Strategy Officer: Standard     Home Equipment: Agricultural consultant (2 wheels);BSC/3in1;Adaptive equipment Adaptive Equipment: Reacher        Prior Functioning/Environment Prior Level of Function : Independent/Modified Independent;Driving             Mobility Comments: independent without device          OT Problem List: Decreased strength;Decreased range of motion;Pain      OT Treatment/Interventions: Self-care/ADL training;Therapeutic activities;DME and/or AE instruction;Patient/family education    OT Goals(Current goals can be found in the care plan section) Acute Rehab OT Goals Patient Stated Goal: be independent OT Goal Formulation: With patient/family Time For Goal Achievement: 10/21/22 Potential to Achieve Goals: Good ADL Goals Pt Will Perform Lower Body Dressing: with caregiver independent in assisting;with modified independence Pt Will Transfer to Toilet: with modified independence Additional ADL Goal #1: Pt will independently instruct family in polar care mgt Additional ADL Goal #2: Pt will independently instruct family in compression stocking mgt.  OT Frequency: Min 1X/week    Co-evaluation               AM-PAC OT "6 Clicks" Daily Activity     Outcome Measure Help from another person eating meals?: None Help from another person taking care of personal grooming?: None Help from another person toileting, which includes using toliet, bedpan, or urinal?: A Little Help from another person bathing (including washing, rinsing, drying)?: A Little Help from another person to put on and taking off regular upper body clothing?: None Help from another person to put on and taking off regular lower body clothing?: A Little 6 Click Score: 21   End of Session    Activity Tolerance: Patient tolerated treatment well Patient left: in chair;with call bell/phone within reach;with family/visitor present  OT Visit Diagnosis: Other abnormalities of gait and mobility (R26.89);Pain Pain - Right/Left: Left Pain - part of body: Knee                Time: 1610-9604 OT Time Calculation (min): 19 min Charges:  OT General Charges $OT Visit: 1 Visit OT Evaluation $OT Eval Low Complexity: 1 Low OT Treatments $Self Care/Home Management : 8-22 mins  Arman Filter., MPH, MS, OTR/L ascom [phone] 10/07/22, 5:02 PM

## 2022-10-07 NOTE — Evaluation (Signed)
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  Clinical Impression  Patient is agreeable to PT. He reports he is independent with mobility at baseline. He lives with his spouse.  Today, the patient reports 5/10 pain in the left knee with mobility. Gait training initiated with occasional cues for rolling walker placement, heel-toe gait pattern. Min guard assistance provided with bed mobility, transfers, and ambulation. Recommend to continue PT to maximize independence and decrease caregiver burden following total knee replacement.        Recommendations for follow up therapy are one component of a multi-disciplinary discharge planning process, led by the attending physician.  Recommendations may be updated based on patient status, additional functional criteria and insurance authorization.  Follow Up Recommendations       Assistance Recommended at Discharge PRN  Patient can return home with the following  Assist for transportation;Help with stairs or ramp for entrance    Equipment Recommendations None recommended by PT  Recommendations for Other Services       Functional Status Assessment Patient has had a recent decline in their functional status and demonstrates the ability to make significant improvements in function in a reasonable and predictable amount of time.     Precautions / Restrictions Precautions Precautions: Knee;Fall Precaution Booklet Issued: Yes (comment) Required Braces or Orthoses:  (KI if unable to SLR) Restrictions Weight Bearing Restrictions: Yes LLE Weight Bearing: Weight bearing as tolerated      Mobility  Bed Mobility Overal bed mobility: Needs Assistance Bed Mobility: Supine to Sit     Supine to sit: Min guard     General bed mobility  comments: increased time required to complete tasks    Transfers Overall transfer level: Needs assistance Equipment used: Rolling walker (2 wheels) Transfers: Sit to/from Stand Sit to Stand: Min guard           General transfer comment: verbal cues for hand placement and LLE positioning with sitting    Ambulation/Gait Ambulation/Gait assistance: Min guard Gait Distance (Feet): 70 Feet Assistive device: Rolling walker (2 wheels) Gait Pattern/deviations: Step-through pattern, Step-to pattern, Decreased stance time - right Gait velocity: decreased     General Gait Details: verbal cues for technique, heel-toe contact, placement of rolling walker. step to pattern initially progressing to step through. pain reported 5/10 pain with walking  Stairs            Wheelchair Mobility    Modified Rankin (Stroke Patients Only)       Balance Overall balance assessment: Needs assistance Sitting-balance support: Feet supported Sitting balance-Leahy Scale: Good     Standing balance support: Bilateral upper extremity supported Standing balance-Leahy Scale: Fair Standing balance comment: using rolling walker for support in standing                             Pertinent Vitals/Pain Pain Assessment Pain Assessment: 0-10 Pain Score: 5  Pain Location: L knee Pain Descriptors / Indicators: Discomfort, Sore Pain Intervention(s): Limited activity within patient's tolerance, Monitored during session, Repositioned (polar care reapplied)    Home Living Family/patient expects to be discharged to:: Private residence Living Arrangements: Spouse/significant other Available Help at Discharge: Family Type of Home: House Home Access: Stairs to enter Entrance Stairs-Rails:  (garage has no rail,  front steps with rail) Entrance Stairs-Number of Steps: 2   Home Layout: One level Home Equipment: Agricultural consultant (2 wheels);BSC/3in1      Prior Function Prior Level of Function :  Independent/Modified Independent;Driving             Mobility Comments: independent without device       Hand Dominance        Extremity/Trunk Assessment        Lower Extremity Assessment Lower Extremity Assessment: LLE deficits/detail LLE Deficits / Details: SLR independently x 5 reps. dorsiflexion at least 4/5 LLE Sensation: WNL       Communication   Communication: No difficulties  Cognition Arousal/Alertness: Awake/alert Behavior During Therapy: WFL for tasks assessed/performed Overall Cognitive Status: Within Functional Limits for tasks assessed                                          General Comments      Exercises Total Joint Exercises Straight Leg Raises: AROM, Strengthening, Left, 5 reps, Supine Goniometric ROM: L knee 5-70 degrees Other Exercises Other Exercises: home exercise handout provided   Assessment/Plan    PT Assessment Patient needs continued PT services  PT Problem List Decreased strength;Decreased range of motion;Decreased activity tolerance;Decreased balance;Decreased mobility;Decreased safety awareness;Decreased knowledge of precautions       PT Treatment Interventions DME instruction;Gait training;Stair training;Therapeutic activities;Functional mobility training;Therapeutic exercise;Balance training;Neuromuscular re-education;Cognitive remediation;Patient/family education    PT Goals (Current goals can be found in the Care Plan section)  Acute Rehab PT Goals Patient Stated Goal: to return home PT Goal Formulation: With patient Time For Goal Achievement: 10/21/22 Potential to Achieve Goals: Good    Frequency BID     Co-evaluation               AM-PAC PT "6 Clicks" Mobility  Outcome Measure Help needed turning from your back to your side while in a flat bed without using bedrails?: None Help needed moving from lying on your back to sitting on the side of a flat bed without using bedrails?: A  Little Help needed moving to and from a bed to a chair (including a wheelchair)?: A Little Help needed standing up from a chair using your arms (e.g., wheelchair or bedside chair)?: A Little Help needed to walk in hospital room?: A Little Help needed climbing 3-5 steps with a railing? : A Little 6 Click Score: 19    End of Session Equipment Utilized During Treatment: Gait belt Activity Tolerance: Patient tolerated treatment well Patient left: in chair;with call bell/phone within reach;with nursing/sitter in room;with family/visitor present;with SCD's reapplied (polar care re-applied) Nurse Communication: Mobility status PT Visit Diagnosis: Other abnormalities of gait and mobility (R26.89);Difficulty in walking, not elsewhere classified (R26.2)    Time: 6213-0865 PT Time Calculation (min) (ACUTE ONLY): 48 min   Charges:   PT Evaluation $PT Eval Low Complexity: 1 Low PT Treatments $Gait Training: 8-22 mins $Therapeutic Activity: 8-22 mins       Donna Bernard, PT, MPT   Ina Homes 10/07/2022, 3:46 PM

## 2022-10-07 NOTE — Transfer of Care (Signed)
Immediate Anesthesia Transfer of Care Note  Patient: Mason Mueller  Procedure(s) Performed: COMPUTER ASSISTED TOTAL KNEE ARTHROPLASTY (Left: Knee)  Patient Location: PACU  Anesthesia Type:General  Level of Consciousness: awake and alert   Airway & Oxygen Therapy: Patient Spontanous Breathing and Patient connected to face mask oxygen  Post-op Assessment: Report given to RN and Post -op Vital signs reviewed and stable  Post vital signs: Reviewed and stable  Last Vitals:  Vitals Value Taken Time  BP    Temp 36.7 C 10/07/22 1115  Pulse    Resp    SpO2      Last Pain:  Vitals:   10/07/22 0609  PainSc: 1          Complications: No notable events documented.

## 2022-10-07 NOTE — Op Note (Signed)
OPERATIVE NOTE  DATE OF SURGERY:  10/07/2022  PATIENT NAME:  Mason Mueller   DOB: 1940-08-31  MRN: [id number]  PRE-OPERATIVE DIAGNOSIS: Degenerative arthrosis of the left knee, primary  POST-OPERATIVE DIAGNOSIS:  Same  PROCEDURE:  Left total knee arthroplasty using computer-assisted navigation  SURGEON:  Jena Gauss. M.D.  ASSISTANT:  Gean Birchwood, PA-C (present and scrubbed throughout the case, critical for assistance with exposure, retraction, instrumentation, and closure)  ANESTHESIA: spinal  ESTIMATED BLOOD LOSS: 50 mL  FLUIDS REPLACED: 1200 mL of crystalloid  TOURNIQUET TIME: 89 minutes  DRAINS: 2 medium Hemovac drains  SOFT TISSUE RELEASES: Anterior cruciate ligament, posterior cruciate ligament, deep medial collateral ligament, patellofemoral ligament  IMPLANTS UTILIZED: DePuy Attune size 7 posterior stabilized femoral component (cemented), size 8 rotating platform tibial component (cemented), 41 mm medialized dome patella (cemented), and a 5 mm stabilized rotating platform polyethylene insert.  INDICATIONS FOR SURGERY: Mason Mueller is a 82 y.o. year old male with a long history of progressive knee pain. X-rays demonstrated severe degenerative changes in tricompartmental fashion. The patient had not seen any significant improvement despite conservative nonsurgical intervention. After discussion of the risks and benefits of surgical intervention, the patient expressed understanding of the risks benefits and agree with plans for total knee arthroplasty.   The risks, benefits, and alternatives were discussed at length including but not limited to the risks of infection, bleeding, nerve injury, stiffness, blood clots, the need for revision surgery, cardiopulmonary complications, among others, and they were willing to proceed.  PROCEDURE IN DETAIL: The patient was brought into the operating room and, after adequate spinal anesthesia was achieved, a tourniquet was placed  on the patient's upper thigh. The patient's knee and leg were cleaned and prepped with alcohol and DuraPrep and draped in the usual sterile fashion. A "timeout" was performed as per usual protocol. The lower extremity was exsanguinated using an Esmarch, and the tourniquet was inflated to 300 mmHg. An anterior longitudinal incision was made followed by a standard mid vastus approach. The deep fibers of the medial collateral ligament were elevated in a subperiosteal fashion off of the medial flare of the tibia so as to maintain a continuous soft tissue sleeve. The patella was subluxed laterally and the patellofemoral ligament was incised. Inspection of the knee demonstrated severe degenerative changes with full-thickness loss of articular cartilage. Osteophytes were debrided using a rongeur. Anterior and posterior cruciate ligaments were excised. Two 4.0 mm Schanz pins were inserted in the femur and into the tibia for attachment of the array of trackers used for computer-assisted navigation. Hip center was identified using a circumduction technique. Distal landmarks were mapped using the computer. The distal femur and proximal tibia were mapped using the computer. The distal femoral cutting guide was positioned using computer-assisted navigation so as to achieve a 5 distal valgus cut. The femur was sized and it was felt that a size 7 femoral component was appropriate. A size 7 femoral cutting guide was positioned and the anterior cut was performed and verified using the computer. This was followed by completion of the posterior and chamfer cuts. Femoral cutting guide for the central box was then positioned in the center box cut was performed.  Attention was then directed to the proximal tibia. Medial and lateral menisci were excised. The extramedullary tibial cutting guide was positioned using computer-assisted navigation so as to achieve a 0 varus-valgus alignment and 3 posterior slope. The cut was performed and  verified using the computer. The proximal tibia  was sized and it was felt that a size 8 tibial tray was appropriate. Tibial and femoral trials were inserted followed by insertion of a 5 mm polyethylene insert. This allowed for excellent mediolateral soft tissue balancing both in flexion and in full extension. Finally, the patella was cut and prepared so as to accommodate a 41 mm medialized dome patella. A patella trial was placed and the knee was placed through a range of motion with excellent patellar tracking appreciated. The femoral trial was removed after debridement of posterior osteophytes. The central post-hole for the tibial component was reamed followed by insertion of a keel punch. Tibial trials were then removed. Cut surfaces of bone were irrigated with copious amounts of normal saline using pulsatile lavage and then suctioned dry. Polymethylmethacrylate cement with gentamicin was prepared in the usual fashion using a vacuum mixer. Cement was applied to the cut surface of the proximal tibia as well as along the undersurface of a size 8 rotating platform tibial component. Tibial component was positioned and impacted into place. Excess cement was removed using Personal assistant. Cement was then applied to the cut surfaces of the femur as well as along the posterior flanges of the size 7 femoral component. The femoral component was positioned and impacted into place. Excess cement was removed using Personal assistant. A 5 mm polyethylene trial was inserted and the knee was brought into full extension with steady axial compression applied. Finally, cement was applied to the backside of a 41 mm medialized dome patella and the patellar component was positioned and patellar clamp applied. Excess cement was removed using Personal assistant. After adequate curing of the cement, the tourniquet was deflated after a total tourniquet time of 89 minutes. Hemostasis was achieved using electrocautery. The knee was irrigated with  copious amounts of normal saline using pulsatile lavage followed by 450 ml of Surgiphor and then suctioned dry. 20 mL of 1.3% Exparel and 60 mL of 0.25% Marcaine in 40 mL of normal saline was injected along the posterior capsule, medial and lateral gutters, and along the arthrotomy site. A 5 mm stabilized rotating platform polyethylene insert was inserted and the knee was placed through a range of motion with excellent mediolateral soft tissue balancing appreciated and excellent patellar tracking noted. 2 medium drains were placed in the wound bed and brought out through separate stab incisions. The medial parapatellar portion of the incision was reapproximated using interrupted sutures of #1 Vicryl. Subcutaneous tissue was approximated in layers using first #0 Vicryl followed #2-0 Vicryl. The skin was approximated with skin staples. A sterile dressing was applied.  The patient tolerated the procedure well and was transported to the recovery room in stable condition.    Yama Nielson P. Angie Fava., M.D.

## 2022-10-07 NOTE — Interval H&P Note (Signed)
History and Physical Interval Note:  10/07/2022 6:04 AM  Mason Mueller  has presented today for surgery, with the diagnosis of PRIMARY OSTEOARTHRITIS OF LEFT KNEE..  The various methods of treatment have been discussed with the patient and family. After consideration of risks, benefits and other options for treatment, the patient has consented to  Procedure(s): COMPUTER ASSISTED TOTAL KNEE ARTHROPLASTY - RNFA (Left) as a surgical intervention.  The patient's history has been reviewed, patient examined, no change in status, stable for surgery.  I have reviewed the patient's chart and labs.  Questions were answered to the patient's satisfaction.     Arnol Mcgibbon P Nandini Bogdanski

## 2022-10-07 NOTE — Anesthesia Procedure Notes (Signed)
Spinal  Patient location during procedure: OR Reason for block: surgical anesthesia Staffing Performed: resident/CRNA  Resident/CRNA: Mathews Argyle, CRNA Performed by: Mathews Argyle, CRNA Authorized by: Stephanie Coup, MD   Preanesthetic Checklist Completed: patient identified, IV checked, site marked, risks and benefits discussed, surgical consent, monitors and equipment checked, pre-op evaluation and timeout performed Spinal Block Patient position: sitting Prep: ChloraPrep and site prepped and draped Patient monitoring: heart rate, continuous pulse ox, blood pressure and cardiac monitor Approach: midline Location: L4-5 Injection technique: single-shot Needle Needle type: Whitacre and Introducer  Needle gauge: 24 G Needle length: 9 cm Assessment Events: CSF return Additional Notes Negative paresthesia. Negative blood return. Positive free-flowing CSF. Expiration date of kit checked and confirmed. Patient tolerated procedure well, without complications.

## 2022-10-07 NOTE — Anesthesia Preprocedure Evaluation (Signed)
Anesthesia Evaluation  Patient identified by MRN, date of birth, ID band Patient awake    Reviewed: Allergy & Precautions, H&P , NPO status , Patient's Chart, lab work & pertinent test results, reviewed documented beta blocker date and time   Airway Mallampati: IV  TM Distance: >3 FB Neck ROM: full    Dental  (+) Chipped, Dental Advidsory Given, Teeth Intact   Pulmonary sleep apnea , COPD,  COPD inhaler   Pulmonary exam normal        Cardiovascular hypertension, On Medications (-) angina (-) Past MI negative cardio ROS Normal cardiovascular exam     Neuro/Psych negative neurological ROS  negative psych ROS   GI/Hepatic negative GI ROS, Neg liver ROS,,,  Endo/Other  negative endocrine ROSdiabetes    Renal/GU      Musculoskeletal   Abdominal   Peds  Hematology negative hematology ROS (+)   Anesthesia Other Findings Past Medical History: No date: Adenomatous polyp of colon No date: Benign prostate hyperplasia No date: Controlled type 2 diabetes mellitus without complication,  without long-term current use of insulin (HCC) No date: COPD (chronic obstructive pulmonary disease) (HCC)     Comment:  mild No date: Erectile dysfunction No date: Helicobacter pylori gastritis No date: Hydrocele, bilateral No date: Hyperlipidemia No date: Hypertension No date: Hyperuricemia No date: Iron deficiency anemia No date: Mild left ventricular systolic dysfunction No date: Morbid obesity (HCC) No date: Neutropenia (HCC) No date: Osteoarthritis of both knees No date: Pure hypercholesterolemia No date: Sleep apnea  Past Surgical History: 2008: COLONOSCOPY 2013: COLONOSCOPY 07/23/2017: COLONOSCOPY WITH PROPOFOL; N/A     Comment:  Procedure: COLONOSCOPY WITH PROPOFOL;  Surgeon: Scot Jun, MD;  Location: [facility] ENDOSCOPY;  Service:               Endoscopy;  Laterality: N/A; 07/23/2017:  ESOPHAGOGASTRODUODENOSCOPY (EGD) WITH PROPOFOL; N/A     Comment:  Procedure: ESOPHAGOGASTRODUODENOSCOPY (EGD) WITH               PROPOFOL;  Surgeon: Scot Jun, MD;  Location:               [facility] ENDOSCOPY;  Service: Endoscopy;  Laterality: N/A; 2006: KNEE ARTHROSCOPY; Right 08/2006: KNEE ARTHROSCOPY; Left 11/2001: LUMBAR LAMINECTOMY     Comment:  L4-5  BMI    Body Mass Index: 37.40 kg/m      Reproductive/Obstetrics negative OB ROS                             Anesthesia Physical Anesthesia Plan  ASA: 3  Anesthesia Plan: Spinal   Post-op Pain Management:    Induction:   PONV Risk Score and Plan: 2 and Propofol infusion, TIVA, Midazolam and Ondansetron  Airway Management Planned: Natural Airway and Nasal Cannula  Additional Equipment:   Intra-op Plan:   Post-operative Plan:   Informed Consent: I have reviewed the patients History and Physical, chart, labs and discussed the procedure including the risks, benefits and alternatives for the proposed anesthesia with the patient or authorized representative who has indicated his/her understanding and acceptance.     Dental Advisory Given  Plan Discussed with: Anesthesiologist, CRNA and Surgeon  Anesthesia Plan Comments: (Patient reports no bleeding problems and no anticoagulant use.  Plan for spinal with backup GA  Patient consented for risks of anesthesia including but not limited to:  - adverse reactions  to medications - damage to eyes, teeth, lips or other oral mucosa - nerve damage due to positioning  - risk of bleeding, infection and or nerve damage from spinal that could lead to paralysis - risk of headache or failed spinal - damage to teeth, lips or other oral mucosa - sore throat or hoarseness - damage to heart, brain, nerves, lungs, other parts of body or loss of life  Patient voiced understanding.)        Anesthesia Quick Evaluation

## 2022-10-08 ENCOUNTER — Encounter: Payer: Self-pay | Admitting: Orthopedic Surgery

## 2022-10-08 DIAGNOSIS — M1712 Unilateral primary osteoarthritis, left knee: Secondary | ICD-10-CM | POA: Diagnosis not present

## 2022-10-08 LAB — GLUCOSE, CAPILLARY
Glucose-Capillary: 98 mg/dL (ref 70–99)
Glucose-Capillary: 100 mg/dL — ABNORMAL HIGH (ref 70–99)

## 2022-10-08 MED ORDER — METOCLOPRAMIDE HCL 10 MG PO TABS
ORAL_TABLET | ORAL | Status: AC
  Filled 2022-10-08: qty 1

## 2022-10-08 MED ORDER — TRAMADOL HCL 50 MG PO TABS
ORAL_TABLET | ORAL | Status: AC
  Filled 2022-10-08: qty 1

## 2022-10-08 MED ORDER — LISINOPRIL 20 MG PO TABS
ORAL_TABLET | ORAL | Status: AC
  Filled 2022-10-08: qty 2

## 2022-10-08 MED ORDER — SENNOSIDES-DOCUSATE SODIUM 8.6-50 MG PO TABS
ORAL_TABLET | ORAL | Status: AC
  Filled 2022-10-08: qty 1

## 2022-10-08 MED ORDER — OXYCODONE HCL 5 MG PO TABS: 5.0000 mg | ORAL_TABLET | ORAL | 0 refills | Status: DC | PRN

## 2022-10-08 MED ORDER — MAGNESIUM HYDROXIDE 400 MG/5ML PO SUSP
ORAL | Status: AC
  Filled 2022-10-08: qty 30

## 2022-10-08 MED ORDER — CELECOXIB 200 MG PO CAPS: 200.0000 mg | ORAL_CAPSULE | Freq: Two times a day (BID) | ORAL | 1 refills | Status: AC

## 2022-10-08 MED ORDER — CELECOXIB 200 MG PO CAPS
ORAL_CAPSULE | ORAL | Status: AC
  Filled 2022-10-08: qty 1

## 2022-10-08 MED ORDER — PANTOPRAZOLE SODIUM 40 MG PO TBEC
DELAYED_RELEASE_TABLET | ORAL | Status: AC
  Filled 2022-10-08: qty 1

## 2022-10-08 MED ORDER — ENOXAPARIN SODIUM 40 MG/0.4ML IJ SOSY: 40.0000 mg | PREFILLED_SYRINGE | INTRAMUSCULAR | 0 refills | Status: DC

## 2022-10-08 MED ORDER — ENOXAPARIN SODIUM 30 MG/0.3ML IJ SOSY
PREFILLED_SYRINGE | INTRAMUSCULAR | Status: AC
  Filled 2022-10-08: qty 0.3

## 2022-10-08 MED ORDER — ACETAMINOPHEN 10 MG/ML IV SOLN
INTRAVENOUS | Status: AC
  Filled 2022-10-08: qty 100

## 2022-10-08 MED ORDER — TRAMADOL HCL 50 MG PO TABS: 50.0000 mg | ORAL_TABLET | ORAL | 0 refills | Status: DC | PRN

## 2022-10-08 MED ORDER — HYDROCHLOROTHIAZIDE 25 MG PO TABS
ORAL_TABLET | ORAL | Status: AC
  Filled 2022-10-08: qty 1

## 2022-10-08 NOTE — Discharge Summary (Addendum)
Physician Discharge Summary  Patient ID: Mason Mueller MRN: [id number] DOB/AGE: 80-18-1942 80 y.o.  Admit date: 10/07/2022 Discharge date: 10/08/2022  Admission Diagnoses:  Total knee replacement status [Z96.659]  Surgeries:Procedure(s): COMPUTER ASSISTED TOTAL KNEE ARTHROPLASTY on 10/07/2022  PROCEDURE:  Left total knee arthroplasty using computer-assisted navigation   SURGEON:  Jena Gauss. M.D.   ASSISTANT:  Gean Birchwood, PA-C (present and scrubbed throughout the case, critical for assistance with exposure, retraction, instrumentation, and closure)   ANESTHESIA: spinal   ESTIMATED BLOOD LOSS: 50 mL   FLUIDS REPLACED: 1200 mL of crystalloid   TOURNIQUET TIME: 89 minutes   DRAINS: 2 medium Hemovac drains   SOFT TISSUE RELEASES: Anterior cruciate ligament, posterior cruciate ligament, deep medial collateral ligament, patellofemoral ligament   IMPLANTS UTILIZED: DePuy Attune size 7 posterior stabilized femoral component (cemented), size 8 rotating platform tibial component (cemented), 41 mm medialized dome patella (cemented), and a 5 mm stabilized rotating platform polyethylene insert.  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   Iron deficiency anemia 09/29/2019   Primary osteoarthritis of left knee 02/12/2018   History of tinea cruris 02/11/2018   Severe obesity with body mass index (BMI) of 35.0 to 39.9 with comorbidity (HCC) 10/13/2016   Benign localized hyperplasia of prostate with urinary obstruction 10/09/2016   Incomplete emptying of bladder 10/09/2016   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   Neutropenia (HCC) 09/19/2015   Type 2 diabetes mellitus with other specified complication (HCC) 03/31/2015   High risk medication use 03/08/2015   Hydrocele, unspecified 03/08/2015   Allergic rhinitis 03/02/2014   Benign prostatic hyperplasia with nocturia 03/02/2014   Erectile dysfunction of organic origin  03/02/2014   Hypertension associated with type 2 diabetes mellitus (HCC) 03/02/2014   Mild left ventricular systolic dysfunction 03/02/2014   Pure hypercholesterolemia 03/02/2014   COPD, mild (HCC) 02/17/2014   Sleep apnea, obstructive 02/17/2014   History of adenomatous polyp of colon 06/16/2006    Past Medical History:  Diagnosis Date   Adenomatous polyp of colon    Benign prostate hyperplasia    Controlled type 2 diabetes mellitus without complication, without long-term current use of insulin (HCC)    COPD (chronic obstructive pulmonary disease) (HCC)    mild   Erectile dysfunction    Helicobacter pylori gastritis    Hydrocele, bilateral    Hyperlipidemia    Hypertension    Hyperuricemia    Iron deficiency anemia    Mild left ventricular systolic dysfunction    Morbid obesity (HCC)    Neutropenia (HCC)    Osteoarthritis of both knees    Pure hypercholesterolemia    Sleep apnea      Transfusion: None   Consultants (if any):   Discharged Condition: Improved  Hospital Course: Mason Mueller is an 82 y.o. male who was admitted 10/07/2022 with a diagnosis of left total knee arthroplasty and went to the operating room on 10/07/2022 and underwent left total knee arthroplasty . The patient received perioperative antibiotics for prophylaxis (see below). The patient tolerated the procedure well and was transported to PACU in stable condition. After meeting PA-C U criteria, the patient was subsequently transferred to the Orthopaedics/Rehabilitation unit.   The patient received DVT prophylaxis in the form of early mobilization, Lovenox. A sacral pad had been placed and heels were elevated off of the bed with rolled towels in order to protect skin integrity. Foley catheter was discontinued on postoperative day #1. Wound drains were  discontinued on postoperative day #2. The surgical incision was healing well without signs of infection.  Physical therapy was initiated postoperatively for  transfers, gait training, and strengthening. Occupational therapy was initiated for activities of daily living and evaluation for assisted devices. Rehabilitation goals were reviewed in detail with the patient. The patient made steady progress with physical therapy and physical therapy recommended discharge to Home.   The patient achieved his preliminary goals of this hospitalization and was felt to be medically and orthopaedically appropriate for discharge.  He was given perioperative antibiotics:  Anti-infectives (From admission, onward)    Start     Dose/Rate Route Frequency Ordered Stop   10/07/22 1330  ceFAZolin (ANCEF) IVPB 2g/100 mL premix        2 g 200 mL/hr over 30 Minutes Intravenous Every 6 hours 10/07/22 1249 10/07/22 2205   10/07/22 0600  ceFAZolin (ANCEF) IVPB 2g/100 mL premix        2 g 200 mL/hr over 30 Minutes Intravenous On call to O.R. 10/06/22 2149 10/07/22 0805     .  Recent vital signs:  Vitals:   10/08/22 0414 10/08/22 0457  BP: (!) 158/81 (!) 148/78  Pulse: (!) 101 68  Resp: 18   Temp: (!) 97.1 F (36.2 C)   SpO2: 96% 97%    Recent laboratory studies:  No results for input(s): "WBC", "HGB", "HCT", "PLT", "K", "CL", "CO2", "BUN", "CREATININE", "GLUCOSE", "CALCIUM", "LABPT", "INR" in the last 72 hours.  Diagnostic Studies: DG Knee Left Port  Result Date: 10/07/2022 CLINICAL DATA:  LEFT TKR EXAM: PORTABLE LEFT KNEE - 1-2 VIEW COMPARISON:  Portable exam 1149 hours compared MRI of 08/06/2006 FINDINGS: Osseous mineralization normal. Components of LEFT knee prosthesis identified. No fracture, dislocation, or bone destruction. Anterior surgical drains and skin clips noted. IMPRESSION: LEFT knee prosthesis without acute complication. Electronically Signed   By: Ulyses Southward M.D.   On: 10/07/2022 12:03    Discharge Medications:   Allergies as of 10/08/2022       Reactions   Sulfa Antibiotics Rash, Itching   Other reaction(s): Other (See Comments)   Penicillins  Hives   IgE=233 (WNL) on 09/27/2022   Sulfasalazine Rash        Medication List     STOP taking these medications    aspirin EC 81 MG tablet       TAKE these medications    acetaminophen 500 MG tablet Commonly known as: TYLENOL Take 500 mg by mouth every 8 (eight) hours as needed.   albuterol 108 (90 Base) MCG/ACT inhaler Commonly known as: VENTOLIN HFA Inhale 1 puff into the lungs every 6 (six) hours as needed for wheezing or shortness of breath.   budesonide-formoterol 160-4.5 MCG/ACT inhaler Commonly known as: SYMBICORT Inhale 2 puffs into the lungs 2 (two) times daily.   celecoxib 200 MG capsule Commonly known as: CELEBREX Take 1 capsule (200 mg total) by mouth 2 (two) times daily.   doxazosin 8 MG tablet Commonly known as: CARDURA Take 8 mg by mouth at bedtime.   enoxaparin 40 MG/0.4ML injection Commonly known as: LOVENOX Inject 0.4 mLs (40 mg total) into the skin daily for 14 days.   ferrous sulfate 325 (65 FE) MG tablet TAKE 1 TABLET BY MOUTH ONCE DAILY   hydrochlorothiazide 12.5 MG tablet Commonly known as: HYDRODIURIL Take by mouth.   latanoprost 0.005 % ophthalmic solution Commonly known as: XALATAN Place 1 drop into both eyes at bedtime.   lisinopril 40 MG tablet Commonly known  as: ZESTRIL Take 40 mg by mouth daily.   modafinil 200 MG tablet Commonly known as: PROVIGIL Take 100 mg by mouth daily.   oxyCODONE 5 MG immediate release tablet Commonly known as: Oxy IR/ROXICODONE Take 1 tablet (5 mg total) by mouth every 4 (four) hours as needed for moderate pain (pain score 4-6).   simvastatin 40 MG tablet Commonly known as: ZOCOR Take 40 mg by mouth at bedtime.   traMADol 50 MG tablet Commonly known as: ULTRAM Take 1-2 tablets (50-100 mg total) by mouth every 4 (four) hours as needed for moderate pain.   vitamin B-12 500 MCG tablet Commonly known as: CYANOCOBALAMIN Take 500 mcg by mouth daily.               Durable Medical  Equipment  (From admission, onward)           Start     Ordered   10/07/22 1250  DME Walker rolling  Once       Question:  Patient needs a walker to treat with the following condition  Answer:  Total knee replacement status   10/07/22 1249   10/07/22 1250  DME Bedside commode  Once       Comments: Patient is not able to walk the distance required to go the bathroom, or he/she is unable to safely negotiate stairs required to access the bathroom.  A 3in1 BSC will alleviate this problem  Question:  Patient needs a bedside commode to treat with the following condition  Answer:  Total knee replacement status   10/07/22 1249            Disposition: Discharge disposition: 01-Home or Self Care          Follow-up Information     Tera Partridge, PA Follow up on 10/21/2022.   Specialty: Physician Assistant Why: at 10:15am Contact information: [address] [place] [address] [phone]         Donato Heinz, MD Follow up on 11/19/2022.   Specialty: Orthopedic Surgery Why: at 1:45pm Contact information: [address] [facility] [place] [address] [phone]                 Danise Edge, PA-C [facility] Dept of Orthopedics 10/08/2022   Illene Labrador. Angie Fava., M.D.

## 2022-10-08 NOTE — Plan of Care (Signed)
  Problem: Activity: Goal: Ability to avoid complications of mobility impairment will improve Outcome: Progressing   Problem: Pain Management: Goal: Pain level will decrease with appropriate interventions Outcome: Progressing   Problem: Skin Integrity: Goal: Will show signs of wound healing Outcome: Progressing   

## 2022-10-08 NOTE — Progress Notes (Signed)
Subjective: 1 Day Post-Op Procedure(s) (LRB): COMPUTER ASSISTED TOTAL KNEE ARTHROPLASTY (Left) Patient reports pain as mild.   Patient seen in rounds with Dr. Lequita Halt. Patient is well, and has had no acute complaints or problems We will start therapy today.  Plan is to go Home after hospital stay.  Objective: Vital signs in last 24 hours: Temp:  [97 F (36.1 C)-98 F (36.7 C)] 97.1 F (36.2 C) (04/30 0414) Pulse Rate:  [50-101] 68 (04/30 0457) Resp:  [12-19] 18 (04/30 0414) BP: (137-175)/(71-96) 148/78 (04/30 0457) SpO2:  [92 %-99 %] 97 % (04/30 0457) Weight:  [111.6 kg] 111.6 kg (04/29 1215)  Intake/Output from previous day:  Intake/Output Summary (Last 24 hours) at 10/08/2022 0747 Last data filed at 10/08/2022 0646 Gross per 24 hour  Intake 4170.28 ml  Output 3005 ml  Net 1165.28 ml    Intake/Output this shift: No intake/output data recorded.  Labs: No results for input(s): "HGB" in the last 72 hours. No results for input(s): "WBC", "RBC", "HCT", "PLT" in the last 72 hours. No results for input(s): "NA", "K", "CL", "CO2", "BUN", "CREATININE", "GLUCOSE", "CALCIUM" in the last 72 hours. No results for input(s): "LABPT", "INR" in the last 72 hours.  EXAM General - Patient is Alert, Appropriate, and Oriented Extremity - Neurologically intact ABD soft Neurovascular intact Sensation intact distally Intact pulses distally Dorsiflexion/Plantar flexion intact Compartment soft Dressing -  Dressing and drain left in place given patients output over night. Return later for reevaluation and removal. Motor Function - intact, moving foot and toes well on exam. Able to straight leg raise without difficulty   Past Medical History:  Diagnosis Date   Adenomatous polyp of colon    Benign prostate hyperplasia    Controlled type 2 diabetes mellitus without complication, without long-term current use of insulin (HCC)    COPD (chronic obstructive pulmonary disease) (HCC)    mild    Erectile dysfunction    Helicobacter pylori gastritis    Hydrocele, bilateral    Hyperlipidemia    Hypertension    Hyperuricemia    Iron deficiency anemia    Mild left ventricular systolic dysfunction    Morbid obesity (HCC)    Neutropenia (HCC)    Osteoarthritis of both knees    Pure hypercholesterolemia    Sleep apnea     Assessment/Plan: 1 Day Post-Op Procedure(s) (LRB): COMPUTER ASSISTED TOTAL KNEE ARTHROPLASTY (Left) Principal Problem:   Total knee replacement status  Estimated body mass index is 37.4 kg/m as calculated from the following:   Height as of this encounter: 5\' 8"  (1.727 m).   Weight as of this encounter: 111.6 kg. Up with therapy   DVT Prophylaxis - Lovenox then transition to aspirin 81 mg after 2 weeks Clebrex bid Tramadol and oxycodone ordered for pain control  Continue to wear ted hose stockings Continue to use polar care for pain and inflammation Use bone foam 3x a day for 30 min  Weight-Bearing as tolerated to L leg Work at home with PT for ROM and strength  Follow up with [facility] in 2 weeks for follow up  Plan to go home today once able to pass PT guidelines.  Rayburn Go, PA-C [facility] 10/08/2022, 7:47 AM

## 2022-10-08 NOTE — Anesthesia Postprocedure Evaluation (Signed)
Anesthesia Post Note  Patient: Mason Mueller  Procedure(s) Performed: COMPUTER ASSISTED TOTAL KNEE ARTHROPLASTY (Left: Knee)  Patient location during evaluation: Nursing Unit Anesthesia Type: Spinal Level of consciousness: awake Pain management: pain level controlled Respiratory status: spontaneous breathing Cardiovascular status: stable Postop Assessment: no headache Anesthetic complications: no   No notable events documented.   Last Vitals:  Vitals:   10/08/22 0414 10/08/22 0457  BP: (!) 158/81 (!) 148/78  Pulse: (!) 101 68  Resp: 18   Temp: (!) 36.2 C   SpO2: 96% 97%    Last Pain:  Vitals:   10/08/22 0726  TempSrc:   PainSc: 0-No pain    LLE Motor Response: Purposeful movement (10/08/22 0726) LLE Sensation: Full sensation (10/08/22 0726) RLE Motor Response: Purposeful movement (10/08/22 0726) RLE Sensation: Full sensation (10/08/22 0726)      Jaye Beagle

## 2022-10-08 NOTE — Progress Notes (Signed)
DISCHARGE NOTE:  Pt given discharge instructions and scripts. TED hose on both legs. Pt

## 2022-10-08 NOTE — Progress Notes (Signed)
Physical Therapy Treatment Patient Details Name: Mason Mueller MRN: [id number] DOB: 19-Jun-1940 Today's Date: 10/08/2022   History of Present Illness Patient is a 82 year old male with degenerative arthrosis of the left knee s/p L total knee arthroplasty.    PT Comments    HEP, gait and stair training completed.  Progressing well and acute goals met.  May have some hyper extension with gait but difficult to tell given bulky dressing.  No questions or concerns noted and pt feels good with mobility for discharge.  No further acute PT interventions needed if he is discharged today as planned.   Recommendations for follow up therapy are one component of a multi-disciplinary discharge planning process, led by the attending physician.  Recommendations may be updated based on patient status, additional functional criteria and insurance authorization.  Follow Up Recommendations       Assistance Recommended at Discharge PRN  Patient can return home with the following Assist for transportation;Help with stairs or ramp for entrance   Equipment Recommendations  None recommended by PT    Recommendations for Other Services       Precautions / Restrictions Precautions Precautions: Knee;Fall Precaution Booklet Issued: Yes (comment) Restrictions Weight Bearing Restrictions: Yes LLE Weight Bearing: Weight bearing as tolerated     Mobility  Bed Mobility Overal bed mobility: Modified Independent                  Transfers Overall transfer level: Modified independent Equipment used: Rolling walker (2 wheels) Transfers: Sit to/from Stand Sit to Stand: Modified independent (Device/Increase time)                Ambulation/Gait Ambulation/Gait assistance: Supervision Gait Distance (Feet): 150 Feet Assistive device: Rolling walker (2 wheels)   Gait velocity: decreased         Stairs Stairs: Yes Stairs assistance: Supervision Stair Management: Step to pattern, Forwards,  One rail Right Number of Stairs: 4     Wheelchair Mobility    Modified Rankin (Stroke Patients Only)       Balance Overall balance assessment: Needs assistance Sitting-balance support: Feet supported Sitting balance-Leahy Scale: Normal     Standing balance support: Bilateral upper extremity supported Standing balance-Leahy Scale: Good                              Cognition Arousal/Alertness: Awake/alert Behavior During Therapy: WFL for tasks assessed/performed Overall Cognitive Status: Within Functional Limits for tasks assessed                                          Exercises Total Joint Exercises Goniometric ROM: 0-90 - limited by bulky dressing Other Exercises Other Exercises: supine HEP x 10 and seated LAQ and flexion stretching    General Comments        Pertinent Vitals/Pain Pain Assessment Pain Assessment: Faces Faces Pain Scale: Hurts little more Pain Location: L knee Pain Descriptors / Indicators: Discomfort, Sore Pain Intervention(s): Limited activity within patient's tolerance, Monitored during session, Premedicated before session, Repositioned, Ice applied    Home Living                          Prior Function            PT Goals (current goals can now be found  in the care plan section) Progress towards PT goals: Progressing toward goals    Frequency    BID      PT Plan      Co-evaluation              AM-PAC PT "6 Clicks" Mobility   Outcome Measure  Help needed turning from your back to your side while in a flat bed without using bedrails?: None Help needed moving from lying on your back to sitting on the side of a flat bed without using bedrails?: None Help needed moving to and from a bed to a chair (including a wheelchair)?: None Help needed standing up from a chair using your arms (e.g., wheelchair or bedside chair)?: None Help needed to walk in hospital room?: A Little Help  needed climbing 3-5 steps with a railing? : A Little 6 Click Score: 22    End of Session Equipment Utilized During Treatment: Gait belt Activity Tolerance: Patient tolerated treatment well Patient left: in chair;with call bell/phone within reach;with nursing/sitter in room;with family/visitor present;with SCD's reapplied (polar care re-applied) Nurse Communication: Mobility status PT Visit Diagnosis: Other abnormalities of gait and mobility (R26.89);Difficulty in walking, not elsewhere classified (R26.2)     Time: 1610-9604 PT Time Calculation (min) (ACUTE ONLY): 22 min  Charges:  $Gait Training: 8-22 mins                   Danielle Dess, PTA 10/08/22, 9:36 AM

## 2023-09-23 ENCOUNTER — Encounter: Payer: Self-pay | Admitting: *Deleted

## 2023-09-29 ENCOUNTER — Encounter: Payer: Self-pay | Admitting: *Deleted

## 2023-10-06 ENCOUNTER — Ambulatory Visit
Admission: RE | Admit: 2023-10-06 | Discharge: 2023-10-06 | Disposition: A | Discharge status: Home / Self Care | Payer: Medicare HMO | Attending: Gastroenterology | Admitting: Gastroenterology

## 2023-10-06 ENCOUNTER — Ambulatory Visit: Admitting: Anesthesiology

## 2023-10-06 ENCOUNTER — Encounter
Admission: RE | Disposition: A | Discharge status: Home / Self Care | Payer: Self-pay | Source: Home / Self Care | Attending: Gastroenterology

## 2023-10-06 ENCOUNTER — Encounter: Payer: Self-pay | Admitting: *Deleted

## 2023-10-06 ENCOUNTER — Other Ambulatory Visit: Payer: Self-pay

## 2023-10-06 DIAGNOSIS — K640 First degree hemorrhoids: Secondary | ICD-10-CM | POA: Diagnosis not present

## 2023-10-06 DIAGNOSIS — I10 Essential (primary) hypertension: Secondary | ICD-10-CM | POA: Diagnosis not present

## 2023-10-06 DIAGNOSIS — Z800 Family history of malignant neoplasm of digestive organs: Secondary | ICD-10-CM | POA: Diagnosis not present

## 2023-10-06 DIAGNOSIS — D122 Benign neoplasm of ascending colon: Secondary | ICD-10-CM | POA: Diagnosis not present

## 2023-10-06 DIAGNOSIS — J449 Chronic obstructive pulmonary disease, unspecified: Secondary | ICD-10-CM | POA: Diagnosis not present

## 2023-10-06 DIAGNOSIS — E119 Type 2 diabetes mellitus without complications: Secondary | ICD-10-CM | POA: Insufficient documentation

## 2023-10-06 DIAGNOSIS — Z1211 Encounter for screening for malignant neoplasm of colon: Secondary | ICD-10-CM | POA: Insufficient documentation

## 2023-10-06 HISTORY — DX: Allergic rhinitis, unspecified: J30.9

## 2023-10-06 HISTORY — PX: COLONOSCOPY WITH PROPOFOL: SHX5780

## 2023-10-06 HISTORY — DX: Personal history of adenomatous and serrated colon polyps: Z86.0101

## 2023-10-06 HISTORY — PX: POLYPECTOMY: SHX149

## 2023-10-06 HISTORY — DX: Personal history of other infectious and parasitic diseases: Z86.19

## 2023-10-06 SURGERY — COLONOSCOPY WITH PROPOFOL
Anesthesia: General

## 2023-10-06 MED ORDER — GLYCOPYRROLATE 0.2 MG/ML IJ SOLN
INTRAMUSCULAR | Status: AC
  Filled 2023-10-06: qty 1

## 2023-10-06 MED ORDER — SODIUM CHLORIDE 0.9 % IV SOLN: INTRAVENOUS | Status: DC

## 2023-10-06 MED ORDER — PROPOFOL 10 MG/ML IV BOLUS
INTRAVENOUS | Status: AC
  Filled 2023-10-06: qty 20

## 2023-10-06 MED ORDER — LIDOCAINE HCL (PF) 2 % IJ SOLN
INTRAMUSCULAR | Status: DC | PRN
  Administered 2023-10-06: 100 mg via INTRADERMAL

## 2023-10-06 MED ORDER — ATROPINE SULFATE 0.4 MG/ML IV SOLN
INTRAVENOUS | Status: DC | PRN
  Administered 2023-10-06: .1 mg via INTRAVENOUS

## 2023-10-06 MED ORDER — PROPOFOL 500 MG/50ML IV EMUL
INTRAVENOUS | Status: DC | PRN
  Administered 2023-10-06: 90 mg via INTRAVENOUS
  Administered 2023-10-06: 90 ug/kg/min via INTRAVENOUS

## 2023-10-06 NOTE — Interval H&P Note (Signed)
 History and Physical Interval Note:  10/06/2023 10:43 AM  Mason Mueller  has presented today for surgery, with the diagnosis of h/o TA Polyps.  The various methods of treatment have been discussed with the patient and family. After consideration of risks, benefits and other options for treatment, the patient has consented to  Procedure(s): COLONOSCOPY WITH PROPOFOL  (N/A) as a surgical intervention.  The patient's history has been reviewed, patient examined, no change in status, stable for surgery.  I have reviewed the patient's chart and labs.  Questions were answered to the patient's satisfaction.     Shane Darling  Ok to proceed with colonoscopy

## 2023-10-06 NOTE — Anesthesia Preprocedure Evaluation (Signed)
 Anesthesia Evaluation  Patient identified by MRN, date of birth, ID band Patient awake    Reviewed: Allergy & Precautions, NPO status , Patient's Chart, lab work & pertinent test results  History of Anesthesia Complications Negative for: history of anesthetic complications  Airway Mallampati: II  TM Distance: >3 FB Neck ROM: Full    Dental no notable dental hx. (+) Teeth Intact   Pulmonary sleep apnea and Continuous Positive Airway Pressure Ventilation , COPD,  COPD inhaler, Patient abstained from smoking.Not current smoker, former smoker   Pulmonary exam normal breath sounds clear to auscultation       Cardiovascular Exercise Tolerance: Good METShypertension, Pt. on medications (-) CAD and (-) Past MI (-) dysrhythmias  Rhythm:Regular Rate:Normal - Systolic murmurs    Neuro/Psych negative neurological ROS  negative psych ROS   GI/Hepatic ,neg GERD  ,,(+)     (-) substance abuse    Endo/Other  diabetes, Well Controlled    Renal/GU negative Renal ROS     Musculoskeletal   Abdominal  (+) + obese  Peds  Hematology   Anesthesia Other Findings Past Medical History: No date: Adenomatous polyp of colon No date: Allergic rhinitis No date: Benign prostate hyperplasia No date: Controlled type 2 diabetes mellitus without complication,  without long-term current use of insulin  (HCC) No date: COPD (chronic obstructive pulmonary disease) (HCC)     Comment:  mild No date: Erectile dysfunction No date: Helicobacter pylori gastritis No date: History of adenomatous polyp of colon No date: History of tinea cruris No date: Hydrocele, bilateral No date: Hyperlipidemia No date: Hypertension No date: Hyperuricemia No date: Iron deficiency anemia No date: Mild left ventricular systolic dysfunction No date: Mild left ventricular systolic dysfunction No date: Morbid obesity (HCC) No date: Neutropenia (HCC) No date:  Osteoarthritis of both knees No date: Pure hypercholesterolemia No date: Sleep apnea  Reproductive/Obstetrics                             Anesthesia Physical Anesthesia Plan  ASA: 2  Anesthesia Plan: General   Post-op Pain Management: Minimal or no pain anticipated   Induction: Intravenous  PONV Risk Score and Plan: 2 and Propofol  infusion, TIVA and Ondansetron   Airway Management Planned: Nasal Cannula  Additional Equipment: None  Intra-op Plan:   Post-operative Plan:   Informed Consent: I have reviewed the patients History and Physical, chart, labs and discussed the procedure including the risks, benefits and alternatives for the proposed anesthesia with the patient or authorized representative who has indicated his/her understanding and acceptance.     Dental advisory given  Plan Discussed with: CRNA and Surgeon  Anesthesia Plan Comments: (Discussed risks of anesthesia with patient, including possibility of difficulty with spontaneous ventilation under anesthesia necessitating airway intervention, PONV, and rare risks such as cardiac or respiratory or neurological events, and allergic reactions. Discussed the role of CRNA in patient's perioperative care. Patient understands.)       Anesthesia Quick Evaluation

## 2023-10-06 NOTE — Op Note (Signed)
 [facility] Patient Name: Mason Mueller Procedure Date: 10/06/2023 10:35 AM MRN: [id number] Account #: [id number] Date of Birth: 12-30-1940 Admit Type: Outpatient Age: 83 Room: [facility] ENDO ROOM 3 Gender: Male Note Status: Finalized Instrument Name: Hyman Main [id number] Procedure:             Colonoscopy Indications:           High risk colon cancer surveillance: Personal history                         of colonic polyps, Last colonoscopy 5 years ago Providers:             Leida Puna MD, MD Referring MD:          Emi Hanson (Referring MD) Medicines:             Monitored Anesthesia Care Complications:         No immediate complications. Estimated blood loss:                         Minimal. Procedure:             Pre-Anesthesia Assessment:                        - Prior to the procedure, a History and Physical was                         performed, and patient medications and allergies were                         reviewed. The patient is competent. The risks and                         benefits of the procedure and the sedation options and                         risks were discussed with the patient. All questions                         were answered and informed consent was obtained.                         Patient identification and proposed procedure were                         verified by the physician, the nurse, the                         anesthesiologist, the anesthetist and the technician                         in the endoscopy suite. Mental Status Examination:                         alert and oriented. Airway Examination: normal                         oropharyngeal airway and neck mobility. Respiratory  Examination: clear to auscultation. CV Examination:                         normal. Prophylactic Antibiotics: The patient does not                         require prophylactic antibiotics. Prior                          Anticoagulants: The patient has taken no anticoagulant                         or antiplatelet agents. ASA Grade Assessment: II - A                         patient with mild systemic disease. After reviewing                         the risks and benefits, the patient was deemed in                         satisfactory condition to undergo the procedure. The                         anesthesia plan was to use monitored anesthesia care                         (MAC). Immediately prior to administration of                         medications, the patient was re-assessed for adequacy                         to receive sedatives. The heart rate, respiratory                         rate, oxygen saturations, blood pressure, adequacy of                         pulmonary ventilation, and response to care were                         monitored throughout the procedure. The physical                         status of the patient was re-assessed after the                         procedure.                        After obtaining informed consent, the colonoscope was                         passed under direct vision. Throughout the procedure,                         the patient's blood pressure, pulse, and oxygen  saturations were monitored continuously. The                         Colonoscope was introduced through the anus and                         advanced to the the cecum, identified by appendiceal                         orifice and ileocecal valve. The colonoscopy was                         performed without difficulty. The patient tolerated                         the procedure well. The quality of the bowel                         preparation was good. The ileocecal valve, appendiceal                         orifice, and rectum were photographed. Findings:      The perianal and digital rectal examinations were normal.      A 3 mm polyp was found in the  ascending colon. The polyp was sessile.       The polyp was removed with a cold snare. Resection and retrieval were       complete. Estimated blood loss was minimal.      Internal hemorrhoids were found during retroflexion. The hemorrhoids       were Grade I (internal hemorrhoids that do not prolapse).      The exam was otherwise without abnormality on direct and retroflexion       views. Impression:            - One 3 mm polyp in the ascending colon, removed with                         a cold snare. Resected and retrieved.                        - Internal hemorrhoids.                        - The examination was otherwise normal on direct and                         retroflexion views. Recommendation:        - Discharge patient to home.                        - Resume previous diet.                        - Continue present medications.                        - Await pathology results.                        - Repeat colonoscopy is not recommended due to current  age (2 years or older).                        - Return to referring physician as previously                         scheduled. Procedure Code(s):     --- Professional ---                        [phone], Colonoscopy, flexible; with removal of                         tumor(s), polyp(s), or other lesion(s) by snare                         technique Diagnosis Code(s):     --- Professional ---                        Z86.010, Personal history of colonic polyps                        D12.2, Benign neoplasm of ascending colon                        K64.0, First degree hemorrhoids CPT copyright 2022 [employer]. All rights reserved. The codes documented in this report are preliminary and upon coder review may  be revised to meet current compliance requirements. Leida Puna MD, MD 10/06/2023 11:10:05 AM Number of Addenda: 0 Note Initiated On: 10/06/2023 10:35 AM Scope Withdrawal Time: 0  hours 6 minutes 33 seconds  Total Procedure Duration: 0 hours 13 minutes 53 seconds  Estimated Blood Loss:  Estimated blood loss was minimal.      [facility]

## 2023-10-06 NOTE — Transfer of Care (Signed)
 Immediate Anesthesia Transfer of Care Note  Patient: Mason Mueller  Procedure(s) Performed: COLONOSCOPY WITH PROPOFOL  POLYPECTOMY, INTESTINE  Patient Location: Endoscopy Unit  Anesthesia Type:General  Level of Consciousness: drowsy  Airway & Oxygen Therapy: Patient Spontanous Breathing and Patient connected to face mask oxygen  Post-op Assessment: Report given to RN and Post -op Vital signs reviewed and stable  Post vital signs: Reviewed and stable  Last Vitals:  Vitals Value Taken Time  BP 137/49 10/06/23 1110  Temp 36.5 C 10/06/23 1109  Pulse 61 10/06/23 1110  Resp 11 10/06/23 1110  SpO2 100 % 10/06/23 1110  Vitals shown include unfiled device data.  Last Pain:  Vitals:   10/06/23 1109  TempSrc: Tympanic  PainSc: Asleep         Complications: No notable events documented.

## 2023-10-06 NOTE — Anesthesia Postprocedure Evaluation (Signed)
 Anesthesia Post Note  Patient: Mason Mueller  Procedure(s) Performed: COLONOSCOPY WITH PROPOFOL  POLYPECTOMY, INTESTINE  Patient location during evaluation: Endoscopy Anesthesia Type: General Level of consciousness: awake and alert Pain management: pain level controlled Vital Signs Assessment: post-procedure vital signs reviewed and stable Respiratory status: spontaneous breathing, nonlabored ventilation, respiratory function stable and patient connected to nasal cannula oxygen Cardiovascular status: blood pressure returned to baseline and stable Postop Assessment: no apparent nausea or vomiting Anesthetic complications: no   No notable events documented.   Last Vitals:  Vitals:   10/06/23 1119 10/06/23 1129  BP: (!) 104/48 (!) 153/72  Pulse: 62 61  Resp: 14 17  Temp:    SpO2: 98% 100%    Last Pain:  Vitals:   10/06/23 1129  TempSrc:   PainSc: 0-No pain                 Lattie Poli

## 2023-10-06 NOTE — H&P (Signed)
 Outpatient short stay form Pre-procedure 10/06/2023  Shane Darling, MD  Primary Physician: Eartha Gold, MD  Reason for visit:  Surveillance  History of present illness:    83 y/o gentleman with history of obesity, hypertension, and BPH here for surveillance colonoscopy. Last colonoscopy in 2019 was unremarkable. Brother had colon cancer in his 74's. No blood thinners. No significant abdominal surgeries.    Current Facility-Administered Medications:    0.9 %  sodium chloride  infusion, , Intravenous, Continuous, Terre Zabriskie, Leanora Prophet, MD, Last Rate: 20 mL/hr at 10/06/23 1040, New Bag at 10/06/23 1040  Medications Prior to Admission  Medication Sig Dispense Refill Last Dose/Taking   aspirin 81 MG chewable tablet Chew 81 mg by mouth daily.   09/30/2023   budesonide-formoterol  (SYMBICORT) 160-4.5 MCG/ACT inhaler Inhale 2 puffs into the lungs 2 (two) times daily.   10/06/2023 Morning   lisinopril  (ZESTRIL ) 40 MG tablet Take 40 mg by mouth daily.   10/06/2023 Morning   simvastatin  (ZOCOR ) 40 MG tablet Take 40 mg by mouth at bedtime.   10/05/2023   vitamin B-12 (CYANOCOBALAMIN) 500 MCG tablet Take 500 mcg by mouth daily.   Past Week   acetaminophen  (TYLENOL ) 500 MG tablet Take 500 mg by mouth every 8 (eight) hours as needed.      albuterol  (PROVENTIL  HFA;VENTOLIN  HFA) 108 (90 Base) MCG/ACT inhaler Inhale 1 puff into the lungs every 6 (six) hours as needed for wheezing or shortness of breath. 1 Inhaler 0    celecoxib  (CELEBREX ) 200 MG capsule Take 1 capsule (200 mg total) by mouth 2 (two) times daily. 60 capsule 1    doxazosin  (CARDURA ) 8 MG tablet Take 8 mg by mouth at bedtime.      enoxaparin  (LOVENOX ) 40 MG/0.4ML injection Inject 0.4 mLs (40 mg total) into the skin daily for 14 days. 5.6 mL 0    ferrous sulfate 325 (65 FE) MG tablet TAKE 1 TABLET BY MOUTH ONCE DAILY      hydrochlorothiazide  (HYDRODIURIL ) 12.5 MG tablet Take by mouth.      latanoprost  (XALATAN ) 0.005 % ophthalmic  solution Place 1 drop into both eyes at bedtime.      modafinil  (PROVIGIL ) 200 MG tablet Take 100 mg by mouth daily.      oxyCODONE  (OXY IR/ROXICODONE ) 5 MG immediate release tablet Take 1 tablet (5 mg total) by mouth every 4 (four) hours as needed for moderate pain (pain score 4-6). 30 tablet 0    traMADol  (ULTRAM ) 50 MG tablet Take 1-2 tablets (50-100 mg total) by mouth every 4 (four) hours as needed for moderate pain. (Patient not taking: Reported on 10/06/2023) 30 tablet 0 Not Taking     Allergies  Allergen Reactions   Sulfa Antibiotics Rash and Itching    Other reaction(s): Other (See Comments)   Penicillins Hives    IgE=233 (WNL) on 09/27/2022   Sulfasalazine Rash     Past Medical History:  Diagnosis Date   Adenomatous polyp of colon    Allergic rhinitis    Benign prostate hyperplasia    Controlled type 2 diabetes mellitus without complication, without long-term current use of insulin  (HCC)    COPD (chronic obstructive pulmonary disease) (HCC)    mild   Erectile dysfunction    Helicobacter pylori gastritis    History of adenomatous polyp of colon    History of tinea cruris    Hydrocele, bilateral    Hyperlipidemia    Hypertension    Hyperuricemia    Iron deficiency anemia  Mild left ventricular systolic dysfunction    Mild left ventricular systolic dysfunction    Morbid obesity (HCC)    Neutropenia (HCC)    Osteoarthritis of both knees    Pure hypercholesterolemia    Sleep apnea     Review of systems:  Otherwise negative.    Physical Exam  Gen: Alert, oriented. Appears stated age.  HEENT: PERRLA. Lungs: No respiratory distress CV: RRR Abd: soft, benign, no masses Ext: No edema    Planned procedures: Proceed with colonoscopy. The patient understands the nature of the planned procedure, indications, risks, alternatives and potential complications including but not limited to bleeding, infection, perforation, damage to internal organs and possible  oversedation/side effects from anesthesia. The patient agrees and gives consent to proceed.  Please refer to procedure notes for findings, recommendations and patient disposition/instructions.     Shane Darling, MD [facility]astroenterology

## 2023-10-07 ENCOUNTER — Encounter: Payer: Self-pay | Admitting: Gastroenterology

## 2023-10-07 LAB — SURGICAL PATHOLOGY

## 2024-01-21 ENCOUNTER — Inpatient Hospital Stay

## 2024-01-21 ENCOUNTER — Inpatient Hospital Stay: Attending: Internal Medicine | Admitting: Internal Medicine

## 2024-01-21 ENCOUNTER — Encounter: Payer: Self-pay | Admitting: Internal Medicine

## 2024-01-21 VITALS — BP 150/70 | HR 67 | Temp 97.0°F | Resp 20 | Ht 68.0 in | Wt 248.5 lb

## 2024-01-21 DIAGNOSIS — I129 Hypertensive chronic kidney disease with stage 1 through stage 4 chronic kidney disease, or unspecified chronic kidney disease: Secondary | ICD-10-CM | POA: Insufficient documentation

## 2024-01-21 DIAGNOSIS — Z791 Long term (current) use of non-steroidal anti-inflammatories (NSAID): Secondary | ICD-10-CM | POA: Diagnosis not present

## 2024-01-21 DIAGNOSIS — D649 Anemia, unspecified: Secondary | ICD-10-CM | POA: Insufficient documentation

## 2024-01-21 DIAGNOSIS — N189 Chronic kidney disease, unspecified: Secondary | ICD-10-CM | POA: Insufficient documentation

## 2024-01-21 DIAGNOSIS — Z7951 Long term (current) use of inhaled steroids: Secondary | ICD-10-CM | POA: Insufficient documentation

## 2024-01-21 DIAGNOSIS — E1122 Type 2 diabetes mellitus with diabetic chronic kidney disease: Secondary | ICD-10-CM | POA: Diagnosis not present

## 2024-01-21 DIAGNOSIS — Z7982 Long term (current) use of aspirin: Secondary | ICD-10-CM | POA: Insufficient documentation

## 2024-01-21 DIAGNOSIS — R350 Frequency of micturition: Secondary | ICD-10-CM | POA: Insufficient documentation

## 2024-01-21 DIAGNOSIS — Z87891 Personal history of nicotine dependence: Secondary | ICD-10-CM | POA: Diagnosis not present

## 2024-01-21 DIAGNOSIS — D472 Monoclonal gammopathy: Secondary | ICD-10-CM

## 2024-01-21 DIAGNOSIS — Z79899 Other long term (current) drug therapy: Secondary | ICD-10-CM | POA: Insufficient documentation

## 2024-01-21 DIAGNOSIS — N401 Enlarged prostate with lower urinary tract symptoms: Secondary | ICD-10-CM | POA: Insufficient documentation

## 2024-01-21 LAB — RETICULOCYTES
Immature Retic Fract: 17 % — ABNORMAL HIGH (ref 2.3–15.9)
RBC.: 5.37 MIL/uL (ref 4.22–5.81)
Retic Count, Absolute: 56.9 K/uL (ref 19.0–186.0)
Retic Ct Pct: 1.1 % (ref 0.4–3.1)

## 2024-01-21 LAB — IRON AND TIBC
Iron: 78 ug/dL (ref 45–182)
Saturation Ratios: 23 % (ref 17.9–39.5)
TIBC: 347 ug/dL (ref 250–450)
UIBC: 269 ug/dL

## 2024-01-21 LAB — COMPREHENSIVE METABOLIC PANEL WITH GFR
ALT: 17 U/L (ref 0–44)
AST: 21 U/L (ref 15–41)
Albumin: 3.8 g/dL (ref 3.5–5.0)
Alkaline Phosphatase: 41 U/L (ref 38–126)
Anion gap: 7 (ref 5–15)
BUN: 18 mg/dL (ref 8–23)
CO2: 27 mmol/L (ref 22–32)
Calcium: 9.1 mg/dL (ref 8.9–10.3)
Chloride: 102 mmol/L (ref 98–111)
Creatinine, Ser: 1.16 mg/dL (ref 0.61–1.24)
GFR, Estimated: >60 mL/min (ref >60)
Glucose, Bld: 102 mg/dL — ABNORMAL HIGH (ref 70–99)
Potassium: 3.7 mmol/L (ref 3.5–5.1)
Sodium: 136 mmol/L (ref 135–145)
Total Bilirubin: 0.6 mg/dL (ref 0.0–1.2)
Total Protein: 7.3 g/dL (ref 6.5–8.1)

## 2024-01-21 LAB — CBC WITH DIFFERENTIAL/PLATELET
Abs Immature Granulocytes: 0.01 K/uL (ref 0.00–0.07)
Basophils Absolute: 0 K/uL (ref 0.0–0.1)
Basophils Relative: 1 %
Eosinophils Absolute: 0.2 K/uL (ref 0.0–0.5)
Eosinophils Relative: 6 %
HCT: 40.5 % (ref 39.0–52.0)
Hemoglobin: 12.7 g/dL — ABNORMAL LOW (ref 13.0–17.0)
Immature Granulocytes: 0 %
Lymphocytes Relative: 37 %
Lymphs Abs: 1.3 K/uL (ref 0.7–4.0)
MCH: 23.6 pg — ABNORMAL LOW (ref 26.0–34.0)
MCHC: 31.4 g/dL (ref 30.0–36.0)
MCV: 75.3 fL — ABNORMAL LOW (ref 80.0–100.0)
Monocytes Absolute: 0.3 K/uL (ref 0.1–1.0)
Monocytes Relative: 9 %
Neutro Abs: 1.6 K/uL — ABNORMAL LOW (ref 1.7–7.7)
Neutrophils Relative %: 47 %
Platelets: 190 K/uL (ref 150–400)
RBC: 5.38 MIL/uL (ref 4.22–5.81)
RDW: 16.3 % — ABNORMAL HIGH (ref 11.5–15.5)
WBC: 3.4 K/uL — ABNORMAL LOW (ref 4.0–10.5)
nRBC: 0 % (ref 0.0–0.2)

## 2024-01-21 LAB — VITAMIN B12: Vitamin B-12: 990 pg/mL — ABNORMAL HIGH (ref 180–914)

## 2024-01-21 LAB — LACTATE DEHYDROGENASE: LDH: 142 U/L (ref 98–192)

## 2024-01-21 LAB — FERRITIN: Ferritin: 36 ng/mL (ref 24–336)

## 2024-01-21 NOTE — Progress Notes (Signed)
 Fatigue/weakness: AT TIMES Dyspena: YES COPD Light headedness: NO Blood in stool: NO

## 2024-01-21 NOTE — Progress Notes (Signed)
 [facility] CONSULT NOTE  Patient Care Team: [name], MD as PCP - General (Infectious Diseases) Rennie Cindy SAUNDERS, MD as Consulting Physician (Oncology)  CHIEF COMPLAINTS/PURPOSE OF CONSULTATION: Monoclonal gammopathy  HEMATOLOGY HISTORY  # CKD stage- [Dr.]  HISTORY OF PRESENTING ILLNESS: Patient ambulating-independently/. Alone   CHANC Mason Mueller 83 y.o.  male has been referred to us  for further evaluation/work-up for monoclonal gammopathy.  Patient noted to have elevated M protein as part of workup of chronic anemia by PCP.   Patient noted to have mild pain in the mid back for the last 3 months.  Worse in the morning improves with rest of the day.  Tingling numbness in extremities-mild in the lower extremities.  Patient with increased frequency of urination.  Denies any burning pain.  Review of Systems  Constitutional:  Positive for malaise/fatigue. Negative for chills, diaphoresis, fever and weight loss.  HENT:  Negative for nosebleeds and sore throat.   Eyes:  Negative for double vision.  Respiratory:  Negative for cough, hemoptysis, sputum production, shortness of breath and wheezing.   Cardiovascular:  Negative for chest pain, palpitations, orthopnea and leg swelling.  Gastrointestinal:  Negative for abdominal pain, blood in stool, constipation, diarrhea, heartburn, melena, nausea and vomiting.  Genitourinary:  Negative for dysuria, frequency and urgency.  Musculoskeletal:  Positive for back pain and joint pain.  Skin: Negative.  Negative for itching and rash.  Neurological:  Negative for dizziness, tingling, focal weakness, weakness and headaches.  Endo/Heme/Allergies:  Does not bruise/bleed easily.  Psychiatric/Behavioral:  Negative for depression. The patient is not nervous/anxious and does not have insomnia.     MEDICAL HISTORY:  Past Medical History:  Diagnosis Date   Allergic rhinitis    Benign prostate hyperplasia    Controlled  type 2 diabetes mellitus without complication, without long-term current use of insulin  (HCC)    COPD (chronic obstructive pulmonary disease) (HCC)    mild   Erectile dysfunction    Helicobacter pylori gastritis    History of adenomatous polyp of colon    History of tinea cruris    Hydrocele, bilateral    Hyperlipidemia    Hypertension    Hyperuricemia    Iron deficiency anemia    Mild left ventricular systolic dysfunction    Mild left ventricular systolic dysfunction    Morbid obesity (HCC)    Neutropenia (HCC)    Osteoarthritis of both knees    Pure hypercholesterolemia    Sleep apnea     SURGICAL HISTORY: Past Surgical History:  Procedure Laterality Date   BACK SURGERY     COLONOSCOPY  2008   COLONOSCOPY  2013   COLONOSCOPY WITH PROPOFOL  N/A 07/23/2017   Procedure: COLONOSCOPY WITH PROPOFOL ;  Surgeon: Viktoria Lamar DASEN, MD;  Location: [facility] ENDOSCOPY;  Service: Endoscopy;  Laterality: N/A;   COLONOSCOPY WITH PROPOFOL  N/A 10/06/2023   Procedure: COLONOSCOPY WITH PROPOFOL ;  Surgeon: Maryruth Ole DASEN, MD;  Location: [facility] ENDOSCOPY;  Service: Endoscopy;  Laterality: N/A;   ESOPHAGOGASTRODUODENOSCOPY (EGD) WITH PROPOFOL  N/A 07/23/2017   Procedure: ESOPHAGOGASTRODUODENOSCOPY (EGD) WITH PROPOFOL ;  Surgeon: Viktoria Lamar DASEN, MD;  Location: [facility] ENDOSCOPY;  Service: Endoscopy;  Laterality: N/A;   JOINT REPLACEMENT     KNEE ARTHROPLASTY Left 10/07/2022   Procedure: COMPUTER ASSISTED TOTAL KNEE ARTHROPLASTY;  Surgeon: Mardee Lynwood SQUIBB, MD;  Location: [facility] ORS;  Service: Orthopedics;  Laterality: Left;   KNEE ARTHROSCOPY Right 2006   KNEE ARTHROSCOPY Left 08/2006   LUMBAR LAMINECTOMY  11/2001  L4-5   POLYPECTOMY  10/06/2023   Procedure: POLYPECTOMY, INTESTINE;  Surgeon: Maryruth Ole DASEN, MD;  Location: [facility] ENDOSCOPY;  Service: Endoscopy;;    SOCIAL HISTORY: Social History   Socioeconomic History   Marital status: Married    Spouse name: Almarie   Number of children: 1    Years of education: Not on file   Highest education level: Not on file  Occupational History   Not on file  Tobacco Use   Smoking status: Former    Types: Cigarettes   Smokeless tobacco: Never  Vaping Use   Vaping status: Never Used  Substance and Sexual Activity   Alcohol use: Not Currently    Comment: RARELY   Drug use: No   Sexual activity: Yes    Birth control/protection: None  Other Topics Concern   Not on file  Social History Narrative   Not on file   Social Drivers of Health   Financial Resource Strain: Low Risk  (01/08/2024)   Received from [facility]   Overall Financial Resource Strain (CARDIA)    Difficulty of Paying Living Expenses: Not hard at all  Food Insecurity: No Food Insecurity (01/21/2024)   Hunger Vital Sign    Worried About Running Out of Food in the Last Year: Never true    Ran Out of Food in the Last Year: Never true  Transportation Needs: No Transportation Needs (01/21/2024)   PRAPARE - Administrator, Civil Service (Medical): No    Lack of Transportation (Non-Medical): No  Physical Activity: Not on file  Stress: Not on file  Social Connections: Not on file  Intimate Partner Violence: Not At Risk (01/21/2024)   Humiliation, Afraid, Rape, and Kick questionnaire    Fear of Current or Ex-Partner: No    Emotionally Abused: No    Physically Abused: No    Sexually Abused: No    FAMILY HISTORY: History reviewed. No pertinent family history.  ALLERGIES:  is allergic to sulfa antibiotics, penicillins, and sulfasalazine.  MEDICATIONS:  Current Outpatient Medications  Medication Sig Dispense Refill   acetaminophen  (TYLENOL ) 500 MG tablet Take 500 mg by mouth every 8 (eight) hours as needed.     albuterol  (PROVENTIL  HFA;VENTOLIN  HFA) 108 (90 Base) MCG/ACT inhaler Inhale 1 puff into the lungs every 6 (six) hours as needed for wheezing or shortness of breath. 1 Inhaler 0   aspirin 81 MG chewable tablet Chew 81 mg by mouth  daily.     budesonide-formoterol  (SYMBICORT) 160-4.5 MCG/ACT inhaler Inhale 2 puffs into the lungs 2 (two) times daily.     celecoxib  (CELEBREX ) 200 MG capsule Take 1 capsule (200 mg total) by mouth 2 (two) times daily. 60 capsule 1   doxazosin  (CARDURA ) 8 MG tablet Take 8 mg by mouth at bedtime.     ferrous sulfate 325 (65 FE) MG tablet TAKE 1 TABLET BY MOUTH ONCE DAILY     hydrochlorothiazide  (HYDRODIURIL ) 12.5 MG tablet Take by mouth.     latanoprost  (XALATAN ) 0.005 % ophthalmic solution Place 1 drop into both eyes at bedtime.     lisinopril  (ZESTRIL ) 40 MG tablet Take 40 mg by mouth daily.     simvastatin  (ZOCOR ) 40 MG tablet Take 40 mg by mouth at bedtime.     vitamin B-12 (CYANOCOBALAMIN ) 500 MCG tablet Take 500 mcg by mouth daily.     No current facility-administered medications for this visit.      PHYSICAL EXAMINATION:   Vitals:   01/21/24 1353  BP: (!) 150/70  Pulse: 67  Resp: 20  Temp: (!) 97 F (36.1 C)  SpO2: 97%   Filed Weights   01/21/24 1353  Weight: 248 lb 8 oz (112.7 kg)    Physical Exam Vitals and nursing note reviewed.  HENT:     Head: Normocephalic and atraumatic.     Mouth/Throat:     Pharynx: Oropharynx is clear.  Eyes:     Extraocular Movements: Extraocular movements intact.     Pupils: Pupils are equal, round, and reactive to light.  Cardiovascular:     Rate and Rhythm: Normal rate and regular rhythm.  Pulmonary:     Comments: Decreased breath sounds bilaterally.  Abdominal:     Palpations: Abdomen is soft.  Musculoskeletal:        General: Normal range of motion.     Cervical back: Normal range of motion.  Skin:    General: Skin is warm.  Neurological:     General: No focal deficit present.     Mental Status: He is alert and oriented to person, place, and time.  Psychiatric:        Behavior: Behavior normal.        Judgment: Judgment normal.     LABORATORY DATA:  I have reviewed the data as listed Lab Results  Component Value  Date   WBC 3.3 (L) 09/27/2022   HGB 12.6 (L) 09/27/2022   HCT 40.8 09/27/2022   MCV 75.3 (L) 09/27/2022   PLT 169 09/27/2022   No results for input(s): NA, K, CL, CO2, GLUCOSE, BUN, CREATININE, CALCIUM, GFRNONAA, GFRAA, PROT, ALBUMIN, AST, ALT, ALKPHOS, BILITOT, BILIDIR, IBILI in the last 8760 hours.   No results found.  No results found for: KPAFRELGTCHN, LAMBDASER, KAPLAMBRATIO   Monoclonal gammopathy # July 2025-abnormal M protein hemoglobin around 11 [chronic microcytosis]-low WBC/mild neutropenia-normal platelet count; normal calcium normal renal function.  # I long discussion regarding the significance of abnormal protein noted in the blood.  Recommend further workup- Check MM panel; cbc/cmp; K/l light chains; / iron studies; ferritin; X-rays.  Discussed the role of bone marrow biopsy for further characterization-risk stratification etc.  However I think is reasonable to hold off any further invasive procedure at this time-pending above work up.  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  Thalassemia.   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  # ? Prostatism: JULY 2025- PSA- 10-  increase in PSA/frequent urination.  Will refer to urology.    Thank you Dr. Epifanio for allowing me to participate in the care of your pleasant patient. Please do not hesitate to contact me with questions or concerns in the interim.  # DISPOSITION: # referral to Urology re: increase in PSA/frequent urination # labs today-MM panel; cbc/cmp; K/l light chains; / iron studies; ferritin; X-rays-  # follow up in 2 weeks- MD; no labs- dr.B   All questions were answered. The patient knows to call the clinic with any problems, questions or concerns.    Cindy JONELLE Joe, MD 01/21/2024 3:02 PM

## 2024-01-21 NOTE — Assessment & Plan Note (Addendum)
#   July 2025-abnormal M protein hemoglobin around 11 [chronic microcytosis]-low WBC/mild neutropenia-normal platelet count; normal calcium normal renal function.  # I long discussion regarding the significance of abnormal protein noted in the blood.  Recommend further workup- Check MM panel; cbc/cmp; K/l light chains; / iron studies; ferritin; X-rays.  Discussed the role of bone marrow biopsy for further characterization-risk stratification etc.  However I think is reasonable to hold off any further invasive procedure at this time-pending above work up.  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  Thalassemia.   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  # ? Prostatism: JULY 2025- PSA- 10-  increase in PSA/frequent urination.  Will refer to urology.    Thank you Dr. Epifanio for allowing me to participate in the care of your pleasant patient. Please do not hesitate to contact me with questions or concerns in the interim.  # DISPOSITION: # referral to Urology re: increase in PSA/frequent urination # labs today-MM panel; cbc/cmp; K/l light chains; / iron studies; ferritin; X-rays-  # follow up in 2 weeks- MD; no labs- dr.B

## 2024-01-22 LAB — KAPPA/LAMBDA LIGHT CHAINS
Kappa free light chain: 26.3 mg/L — ABNORMAL HIGH (ref 3.3–19.4)
Kappa, lambda light chain ratio: 1.93 — ABNORMAL HIGH (ref 0.26–1.65)
Lambda free light chains: 13.6 mg/L (ref 5.7–26.3)

## 2024-01-22 LAB — BETA 2 MICROGLOBULIN, SERUM: Beta-2 Microglobulin: 1.7 mg/L (ref 0.6–2.4)

## 2024-01-22 LAB — HAPTOGLOBIN: Haptoglobin: 135 mg/dL (ref 38–329)

## 2024-01-23 LAB — MULTIPLE MYELOMA PANEL, SERUM
Albumin SerPl Elph-Mcnc: 3.8 g/dL (ref 2.9–4.4)
Albumin/Glob SerPl: 1.3 (ref 0.7–1.7)
Alpha 1: 0.2 g/dL (ref 0.0–0.4)
Alpha2 Glob SerPl Elph-Mcnc: 0.7 g/dL (ref 0.4–1.0)
B-Globulin SerPl Elph-Mcnc: 0.9 g/dL (ref 0.7–1.3)
Gamma Glob SerPl Elph-Mcnc: 1.3 g/dL (ref 0.4–1.8)
Globulin, Total: 3.1 g/dL (ref 2.2–3.9)
IgA: 201 mg/dL (ref 61–437)
IgG (Immunoglobin G), Serum: 1296 mg/dL (ref 603–1613)
IgM (Immunoglobulin M), Srm: 72 mg/dL (ref 15–143)
Total Protein ELP: 6.9 g/dL (ref 6.0–8.5)

## 2024-02-10 ENCOUNTER — Inpatient Hospital Stay: Attending: Internal Medicine | Admitting: Internal Medicine

## 2024-02-10 ENCOUNTER — Encounter: Payer: Self-pay | Admitting: Internal Medicine

## 2024-02-10 VITALS — BP 138/83 | HR 77 | Temp 96.9°F | Resp 16 | Wt 246.0 lb

## 2024-02-10 DIAGNOSIS — Z79899 Other long term (current) drug therapy: Secondary | ICD-10-CM | POA: Insufficient documentation

## 2024-02-10 DIAGNOSIS — D472 Monoclonal gammopathy: Secondary | ICD-10-CM | POA: Insufficient documentation

## 2024-02-10 DIAGNOSIS — Z87891 Personal history of nicotine dependence: Secondary | ICD-10-CM | POA: Insufficient documentation

## 2024-02-10 DIAGNOSIS — N400 Enlarged prostate without lower urinary tract symptoms: Secondary | ICD-10-CM | POA: Diagnosis not present

## 2024-02-10 DIAGNOSIS — E1122 Type 2 diabetes mellitus with diabetic chronic kidney disease: Secondary | ICD-10-CM | POA: Insufficient documentation

## 2024-02-10 DIAGNOSIS — Z7982 Long term (current) use of aspirin: Secondary | ICD-10-CM | POA: Diagnosis not present

## 2024-02-10 DIAGNOSIS — I129 Hypertensive chronic kidney disease with stage 1 through stage 4 chronic kidney disease, or unspecified chronic kidney disease: Secondary | ICD-10-CM | POA: Diagnosis not present

## 2024-02-10 DIAGNOSIS — Z7951 Long term (current) use of inhaled steroids: Secondary | ICD-10-CM | POA: Diagnosis not present

## 2024-02-10 DIAGNOSIS — Z794 Long term (current) use of insulin: Secondary | ICD-10-CM | POA: Diagnosis not present

## 2024-02-10 DIAGNOSIS — Z791 Long term (current) use of non-steroidal anti-inflammatories (NSAID): Secondary | ICD-10-CM | POA: Diagnosis not present

## 2024-02-10 DIAGNOSIS — N189 Chronic kidney disease, unspecified: Secondary | ICD-10-CM | POA: Insufficient documentation

## 2024-02-10 NOTE — Progress Notes (Signed)
 [facility] CONSULT NOTE  Patient Care Team: [name], MD as PCP - General (Infectious Diseases) Rennie Cindy SAUNDERS, MD as Consulting Physician (Oncology)  CHIEF COMPLAINTS/PURPOSE OF CONSULTATION: Monoclonal gammopathy  HEMATOLOGY HISTORY  # CKD stage- [Dr.]  HISTORY OF PRESENTING ILLNESS: Patient ambulating-independently/. Alone   Mason Mueller 83 y.o.  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  Patient noted to have elevated PSA recently currently awaiting evaluation with urology.  Patient has chronic mild tingling numbness in extremities-mild in the lower extremities.  Not any worse.  Review of Systems  Constitutional:  Positive for malaise/fatigue. Negative for chills, diaphoresis, fever and weight loss.  HENT:  Negative for nosebleeds and sore throat.   Eyes:  Negative for double vision.  Respiratory:  Negative for cough, hemoptysis, sputum production, shortness of breath and wheezing.   Cardiovascular:  Negative for chest pain, palpitations, orthopnea and leg swelling.  Gastrointestinal:  Negative for abdominal pain, blood in stool, constipation, diarrhea, heartburn, melena, nausea and vomiting.  Genitourinary:  Negative for dysuria, frequency and urgency.  Musculoskeletal:  Positive for back pain and joint pain.  Skin: Negative.  Negative for itching and rash.  Neurological:  Negative for dizziness, tingling, focal weakness, weakness and headaches.  Endo/Heme/Allergies:  Does not bruise/bleed easily.  Psychiatric/Behavioral:  Negative for depression. The patient is not nervous/anxious and does not have insomnia.     MEDICAL HISTORY:  Past Medical History:  Diagnosis Date   Allergic rhinitis    Benign prostate hyperplasia    Controlled type 2 diabetes mellitus without complication, without long-term current use of insulin  (HCC)    COPD (chronic obstructive pulmonary disease) (HCC)    mild   Erectile dysfunction     Helicobacter pylori gastritis    History of adenomatous polyp of colon    History of tinea cruris    Hydrocele, bilateral    Hyperlipidemia    Hypertension    Hyperuricemia    Iron deficiency anemia    Mild left ventricular systolic dysfunction    Mild left ventricular systolic dysfunction    Morbid obesity (HCC)    Neutropenia (HCC)    Osteoarthritis of both knees    Pure hypercholesterolemia    Sleep apnea     SURGICAL HISTORY: Past Surgical History:  Procedure Laterality Date   BACK SURGERY     COLONOSCOPY  2008   COLONOSCOPY  2013   COLONOSCOPY WITH PROPOFOL  N/A 07/23/2017   Procedure: COLONOSCOPY WITH PROPOFOL ;  Surgeon: Viktoria Lamar DASEN, MD;  Location: [facility] ENDOSCOPY;  Service: Endoscopy;  Laterality: N/A;   COLONOSCOPY WITH PROPOFOL  N/A 10/06/2023   Procedure: COLONOSCOPY WITH PROPOFOL ;  Surgeon: Maryruth Ole DASEN, MD;  Location: [facility] ENDOSCOPY;  Service: Endoscopy;  Laterality: N/A;   ESOPHAGOGASTRODUODENOSCOPY (EGD) WITH PROPOFOL  N/A 07/23/2017   Procedure: ESOPHAGOGASTRODUODENOSCOPY (EGD) WITH PROPOFOL ;  Surgeon: Viktoria Lamar DASEN, MD;  Location: [facility] ENDOSCOPY;  Service: Endoscopy;  Laterality: N/A;   JOINT REPLACEMENT     KNEE ARTHROPLASTY Left 10/07/2022   Procedure: COMPUTER ASSISTED TOTAL KNEE ARTHROPLASTY;  Surgeon: Mardee Lynwood SQUIBB, MD;  Location: [facility] ORS;  Service: Orthopedics;  Laterality: Left;   KNEE ARTHROSCOPY Right 2006   KNEE ARTHROSCOPY Left 08/2006   LUMBAR LAMINECTOMY  11/2001   L4-5   POLYPECTOMY  10/06/2023   Procedure: POLYPECTOMY, INTESTINE;  Surgeon: Maryruth Ole DASEN, MD;  Location: [facility] ENDOSCOPY;  Service: Endoscopy;;    SOCIAL HISTORY: Social History   Socioeconomic History   Marital  status: Married    Spouse name: Almarie   Number of children: 1   Years of education: Not on file   Highest education level: Not on file  Occupational History   Not on file  Tobacco Use   Smoking status: Former    Types: Cigarettes    Smokeless tobacco: Never  Vaping Use   Vaping status: Never Used  Substance and Sexual Activity   Alcohol use: Not Currently    Comment: RARELY   Drug use: No   Sexual activity: Yes    Birth control/protection: None  Other Topics Concern   Not on file  Social History Narrative   Not on file   Social Drivers of Health   Financial Resource Strain: Low Risk  (01/08/2024)   Received from [facility]   Overall Financial Resource Strain (CARDIA)    Difficulty of Paying Living Expenses: Not hard at all  Food Insecurity: No Food Insecurity (01/21/2024)   Hunger Vital Sign    Worried About Running Out of Food in the Last Year: Never true    Ran Out of Food in the Last Year: Never true  Transportation Needs: No Transportation Needs (01/21/2024)   PRAPARE - Administrator, Civil Service (Medical): No    Lack of Transportation (Non-Medical): No  Physical Activity: Not on file  Stress: Not on file  Social Connections: Not on file  Intimate Partner Violence: Not At Risk (01/21/2024)   Humiliation, Afraid, Rape, and Kick questionnaire    Fear of Current or Ex-Partner: No    Emotionally Abused: No    Physically Abused: No    Sexually Abused: No    FAMILY HISTORY: History reviewed. No pertinent family history.  ALLERGIES:  is allergic to sulfa antibiotics, penicillins, and sulfasalazine.  MEDICATIONS:  Current Outpatient Medications  Medication Sig Dispense Refill   acetaminophen  (TYLENOL ) 500 MG tablet Take 500 mg by mouth every 8 (eight) hours as needed.     albuterol  (PROVENTIL  HFA;VENTOLIN  HFA) 108 (90 Base) MCG/ACT inhaler Inhale 1 puff into the lungs every 6 (six) hours as needed for wheezing or shortness of breath. 1 Inhaler 0   aspirin 81 MG chewable tablet Chew 81 mg by mouth daily.     budesonide-formoterol  (SYMBICORT) 160-4.5 MCG/ACT inhaler Inhale 2 puffs into the lungs 2 (two) times daily.     celecoxib  (CELEBREX ) 200 MG capsule Take 1 capsule  (200 mg total) by mouth 2 (two) times daily. 60 capsule 1   doxazosin  (CARDURA ) 8 MG tablet Take 8 mg by mouth at bedtime.     ferrous sulfate 325 (65 FE) MG tablet TAKE 1 TABLET BY MOUTH ONCE DAILY     hydrochlorothiazide  (HYDRODIURIL ) 12.5 MG tablet Take by mouth.     latanoprost  (XALATAN ) 0.005 % ophthalmic solution Place 1 drop into both eyes at bedtime.     lisinopril  (ZESTRIL ) 40 MG tablet Take 40 mg by mouth daily.     simvastatin  (ZOCOR ) 40 MG tablet Take 40 mg by mouth at bedtime.     vitamin B-12 (CYANOCOBALAMIN ) 500 MCG tablet Take 500 mcg by mouth daily.     No current facility-administered medications for this visit.      PHYSICAL EXAMINATION:   Vitals:   02/10/24 1422  BP: 138/83  Pulse: 77  Resp: 16  Temp: (!) 96.9 F (36.1 C)  SpO2: 98%   Filed Weights   02/10/24 1422  Weight: 246 lb (111.6 kg)    Physical Exam  Vitals and nursing note reviewed.  HENT:     Head: Normocephalic and atraumatic.     Mouth/Throat:     Pharynx: Oropharynx is clear.  Eyes:     Extraocular Movements: Extraocular movements intact.     Pupils: Pupils are equal, round, and reactive to light.  Cardiovascular:     Rate and Rhythm: Normal rate and regular rhythm.  Pulmonary:     Comments: Decreased breath sounds bilaterally.  Abdominal:     Palpations: Abdomen is soft.  Musculoskeletal:        General: Normal range of motion.     Cervical back: Normal range of motion.  Skin:    General: Skin is warm.  Neurological:     General: No focal deficit present.     Mental Status: He is alert and oriented to person, place, and time.  Psychiatric:        Behavior: Behavior normal.        Judgment: Judgment normal.     LABORATORY DATA:  I have reviewed the data as listed Lab Results  Component Value Date   WBC 3.4 (L) 01/21/2024   HGB 12.7 (L) 01/21/2024   HCT 40.5 01/21/2024   MCV 75.3 (L) 01/21/2024   PLT 190 01/21/2024   Recent Labs    01/21/24 1454  NA 136  K 3.7   CL 102  CO2 27  GLUCOSE 102*  BUN 18  CREATININE 1.16  CALCIUM 9.1  GFRNONAA >60  PROT 7.3  ALBUMIN 3.8  AST 21  ALT 17  ALKPHOS 41  BILITOT 0.6     No results found.  Lab Results  Component Value Date   KPAFRELGTCHN 26.3 (H) 01/21/2024   LAMBDASER 13.6 01/21/2024   KAPLAMBRATIO 1.93 (H) 01/21/2024     Monoclonal gammopathy # July 2025-PCP- abnormal M protein hemoglobin around 11 [chronic microcytosis]-low WBC/mild neutropenia-normal platelet count; normal calcium normal renal function. AUG 2025-MM panel- NEG K/l light chains= 1.95;  iron studies; ferritin- WNL. HOLD off X-rays. HOLD off bone marrow biopsy.  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  No evidence of any renal sufficiency.  Given the overall otherwise benign nature of the workup-recommend follow-up in about 12 months.  # ANC- 1.6- BEN- mo infections; Anemia-chronic mild 11-12-microcytosis-WNL- iron studies ferritin ?  Thalassemia.   # ? Prostatism: JULY 2025- PSA- 10-  increase in PSA/frequent urination; awaitting urology tomorrow.   # DISPOSITION: # follow up in 12 months-MD; 2 weeks prior-labs- cbc; cmp; MM panel; K/l light chains- Dr.B   All questions were answered. The patient knows to call the clinic with any problems, questions or concerns.    Cindy JONELLE Joe, MD 02/10/2024 2:53 PM

## 2024-02-10 NOTE — Progress Notes (Signed)
 Patient here for follow up; patient denies any concerns at this time.

## 2024-02-10 NOTE — Assessment & Plan Note (Addendum)
#   July 2025-PCP- abnormal M protein hemoglobin around 11 [chronic microcytosis]-low WBC/mild neutropenia-normal platelet count; normal calcium normal renal function. AUG 2025-MM panel- NEG K/l light chains= 1.95;  iron studies; ferritin- WNL. HOLD off X-rays. HOLD off bone marrow biopsy.  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  No evidence of any renal sufficiency.  Given the overall otherwise benign nature of the workup-recommend follow-up in about 12 months.  # ANC- 1.6- BEN- mo infections; Anemia-chronic mild 11-12-microcytosis-WNL- iron studies ferritin ?  Thalassemia.   # ? Prostatism: JULY 2025- PSA- 10-  increase in PSA/frequent urination; awaitting urology tomorrow.   # DISPOSITION: # follow up in 12 months-MD; 2 weeks prior-labs- cbc; cmp; MM panel; K/l light chains- Dr.B

## 2024-02-11 ENCOUNTER — Other Ambulatory Visit
Admission: RE | Admit: 2024-02-11 | Discharge: 2024-02-11 | Disposition: A | Discharge status: Home / Self Care | Attending: Urology | Admitting: Urology

## 2024-02-11 ENCOUNTER — Ambulatory Visit: Admitting: Urology

## 2024-02-11 VITALS — BP 162/73 | HR 80 | Ht 68.0 in | Wt 249.0 lb

## 2024-02-11 DIAGNOSIS — R972 Elevated prostate specific antigen [PSA]: Secondary | ICD-10-CM

## 2024-02-11 DIAGNOSIS — N401 Enlarged prostate with lower urinary tract symptoms: Secondary | ICD-10-CM | POA: Diagnosis not present

## 2024-02-11 DIAGNOSIS — N138 Other obstructive and reflux uropathy: Secondary | ICD-10-CM

## 2024-02-11 LAB — URINALYSIS, COMPLETE (UACMP) WITH MICROSCOPIC
Bilirubin Urine: NEGATIVE
Glucose, UA: NEGATIVE mg/dL
Hgb urine dipstick: NEGATIVE
Ketones, ur: NEGATIVE mg/dL
Leukocytes,Ua: NEGATIVE
Nitrite: NEGATIVE
Protein, ur: NEGATIVE mg/dL
Specific Gravity, Urine: 1.02 (ref 1.005–1.030)
pH: 6.5 (ref 5.0–8.0)

## 2024-02-11 LAB — PSA: Prostatic Specific Antigen: 11.66 ng/mL — ABNORMAL HIGH (ref 0.00–4.00)

## 2024-02-11 NOTE — Assessment & Plan Note (Addendum)
 PSA 10.55 (12/2023) PSA 2.81 (2019) AAM, Fhx of CaP in brother  Recent PSA elevation- minimal prior data points to review  We discussed the significance of an elevated prostate-specific antigen (PSA) level. PSA is a nonspecific marker and may be elevated due to both benign and malignant causes. Benign factors include benign prostatic hyperplasia (BPH), prostatitis or urinary tract infection, recent ejaculation, catheterization or instrumentation, and advancing age. Malignant causes include prostate cancer of varying risk categories.  We reviewed that a single PSA value is less informative than following PSA trends over time, which can better reflect underlying pathology. Risk factors for prostate cancer include increasing age, family history of prostate cancer, African American race, and known germline mutations (e.g., BRCA2).  Next steps may include repeating PSA to confirm elevation, consideration of additional biomarkers or PSA derivatives (e.g., %free PSA, PSA density), and obtaining a multiparametric prostate MRI to assess for suspicious lesions. Based on PSA kinetics, risk profile, and MRI findings, a prostate biopsy may be recommended for definitive diagnosis.   - repeat PSA - UA today  - follow up pending PSA results - will maintain higher threshold for aggressive workup at his age / comorbid condition. At minimum, may consider a prostate MRI vs interval PSA trending

## 2024-02-11 NOTE — Progress Notes (Signed)
 02/11/24 1:00 PM   Mason Mueller Jul 31, 1940 [id number]  CC: elevated PSA   HPI: 83 year old male here for initial evaluation of elevated PSA PSA 10.55 (12/2023) PSA 2.81 (2019)  Patient denies any recent changes in urinary habits, LUTS, dysuria, prostatitis  Fhx of CaP in brother (passed away age 17 unrelated causes)   Previously seen in this clinic in 2021 for BPH, prior bilateral hydroceles  Hx of CPAP, obesity BM 37, COPD, DM2 (A1c 6.5 - 09/2023),    PMH: Past Medical History:  Diagnosis Date   Allergic rhinitis    Benign prostate hyperplasia    Controlled type 2 diabetes mellitus without complication, without long-term current use of insulin  (HCC)    COPD (chronic obstructive pulmonary disease) (HCC)    mild   Erectile dysfunction    Helicobacter pylori gastritis    History of adenomatous polyp of colon    History of tinea cruris    Hydrocele, bilateral    Hyperlipidemia    Hypertension    Hyperuricemia    Iron deficiency anemia    Mild left ventricular systolic dysfunction    Mild left ventricular systolic dysfunction    Morbid obesity (HCC)    Neutropenia (HCC)    Osteoarthritis of both knees    Pure hypercholesterolemia    Sleep apnea     Surgical History: Past Surgical History:  Procedure Laterality Date   BACK SURGERY     COLONOSCOPY  2008   COLONOSCOPY  2013   COLONOSCOPY WITH PROPOFOL  N/A 07/23/2017   Procedure: COLONOSCOPY WITH PROPOFOL ;  Surgeon: Viktoria Lamar DASEN, MD;  Location: [facility] ENDOSCOPY;  Service: Endoscopy;  Laterality: N/A;   COLONOSCOPY WITH PROPOFOL  N/A 10/06/2023   Procedure: COLONOSCOPY WITH PROPOFOL ;  Surgeon: Maryruth Ole DASEN, MD;  Location: [facility] ENDOSCOPY;  Service: Endoscopy;  Laterality: N/A;   ESOPHAGOGASTRODUODENOSCOPY (EGD) WITH PROPOFOL  N/A 07/23/2017   Procedure: ESOPHAGOGASTRODUODENOSCOPY (EGD) WITH PROPOFOL ;  Surgeon: Viktoria Lamar DASEN, MD;  Location: [facility] ENDOSCOPY;  Service: Endoscopy;  Laterality: N/A;   JOINT  REPLACEMENT     KNEE ARTHROPLASTY Left 10/07/2022   Procedure: COMPUTER ASSISTED TOTAL KNEE ARTHROPLASTY;  Surgeon: Mardee Lynwood SQUIBB, MD;  Location: [facility] ORS;  Service: Orthopedics;  Laterality: Left;   KNEE ARTHROSCOPY Right 2006   KNEE ARTHROSCOPY Left 08/2006   LUMBAR LAMINECTOMY  11/2001   L4-5   POLYPECTOMY  10/06/2023   Procedure: POLYPECTOMY, INTESTINE;  Surgeon: Maryruth Ole DASEN, MD;  Location: [facility] ENDOSCOPY;  Service: Endoscopy;;    Family History: No family history on file.  Social History:  reports that he has quit smoking. His smoking use included cigarettes. He has never used smokeless tobacco. He reports that he does not currently use alcohol. He reports that he does not use drugs.      Physical Exam: BP (!) 162/73   Pulse 80   Ht 5' 8 (1.727 m)   Wt 249 lb (112.9 kg)   BMI 37.86 kg/m    Constitutional:  Alert and oriented, No acute distress. Cardiovascular: No clubbing, cyanosis, or edema. Respiratory: Normal respiratory effort, no increased work of breathing. GI: Nondistended GU: ~40-50g gland, limited by body habitus. No discrete nodules or asymmetry, no rectal blood Skin: No rashes, bruises or suspicious lesions. Neurologic: Grossly intact, no focal deficits, moving all 4 extremities. Psychiatric: Normal mood and affect.  Laboratory Data:  Latest Reference Range & Units 01/21/24 14:54  Creatinine 0.61 - 1.24 mg/dL 8.83   PSA 10.55 (12/2023) PSA 2.81 (2019)  Pertinent Imaging: N/A     Assessment & Plan:    Elevated PSA Assessment & Plan: PSA 10.55 (12/2023) PSA 2.81 (2019) AAM, Fhx of CaP in brother  Recent PSA elevation- minimal prior data points to review  We discussed the significance of an elevated prostate-specific antigen (PSA) level. PSA is a nonspecific marker and may be elevated due to both benign and malignant causes. Benign factors include benign prostatic hyperplasia (BPH), prostatitis or urinary tract infection, recent  ejaculation, catheterization or instrumentation, and advancing age. Malignant causes include prostate cancer of varying risk categories.  We reviewed that a single PSA value is less informative than following PSA trends over time, which can better reflect underlying pathology. Risk factors for prostate cancer include increasing age, family history of prostate cancer, African American race, and known germline mutations (e.g., BRCA2).  Next steps may include repeating PSA to confirm elevation, consideration of additional biomarkers or PSA derivatives (e.g., %free PSA, PSA density), and obtaining a multiparametric prostate MRI to assess for suspicious lesions. Based on PSA kinetics, risk profile, and MRI findings, a prostate biopsy may be recommended for definitive diagnosis.   - repeat PSA - UA today  - follow up pending PSA results - will maintain higher threshold for aggressive workup at his age / comorbid condition. At minimum, may consider a prostate MRI vs interval PSA trending  Orders: -     Urinalysis, Complete w Microscopic; Future -     PSA; Future  Benign localized hyperplasia of prostate with urinary obstruction      Mason Skye, MD 02/11/2024  [facility] [address] [place], [place] [address] [phone]

## 2024-02-15 ENCOUNTER — Ambulatory Visit: Payer: Self-pay | Admitting: Urology

## 2024-02-15 DIAGNOSIS — R972 Elevated prostate specific antigen [PSA]: Secondary | ICD-10-CM

## 2024-02-16 NOTE — Telephone Encounter (Signed)
 Called Pt today and gave him the scheduling number so he could set up his prostate mri exam. Also informed patient of the prior prep needed before the appointment. Before hanging up, I made sure to ask if he had any additional questions pt said he understood and he did not have any questions. Myrtha Rubinstein, CMA

## 2024-02-18 ENCOUNTER — Other Ambulatory Visit: Payer: Self-pay | Admitting: Urology

## 2024-02-18 MED ORDER — DIAZEPAM 5 MG PO TABS
5.0000 mg | ORAL_TABLET | Freq: Four times a day (QID) | ORAL | 0 refills | Status: DC | PRN
Start: 2024-02-18 — End: 2024-03-19

## 2024-02-18 NOTE — Progress Notes (Signed)
 Ordered 5mg  Valium  before MRI 2/2 claustrophobia

## 2024-02-23 ENCOUNTER — Telehealth: Payer: Self-pay

## 2024-02-23 NOTE — Telephone Encounter (Signed)
 Pt called to ask about what he needs to do about having claustrophobic in an MRI. I let him know that the Dr. Had already called in a valium  to be taken 1 hour before the test and that he needed to have a driver for himself that day. Pt stated he will go to pharmacy today and pick up medication.

## 2024-02-27 ENCOUNTER — Ambulatory Visit
Admission: RE | Admit: 2024-02-27 | Discharge: 2024-02-27 | Disposition: A | Discharge status: Home / Self Care | Source: Ambulatory Visit | Attending: Urology | Admitting: Urology

## 2024-02-27 DIAGNOSIS — R972 Elevated prostate specific antigen [PSA]: Secondary | ICD-10-CM | POA: Insufficient documentation

## 2024-02-27 MED ORDER — GADOBUTROL 1 MMOL/ML IV SOLN
10.0000 mL | Freq: Once | INTRAVENOUS | Status: AC | PRN
  Administered 2024-02-27: 10 mL via INTRAVENOUS

## 2024-03-09 ENCOUNTER — Telehealth: Payer: Self-pay | Admitting: *Deleted

## 2024-03-09 NOTE — Telephone Encounter (Signed)
 Patient called for the results of the MRI.  I see that it is with Dr. Georganne who set it up.  Patient says that both of them are working together.  Wanted to know what the results are.

## 2024-03-12 NOTE — Progress Notes (Signed)
03/19/2024 8:46 AM   Mason Mueller Dec 25, 1940 [id number]   I connected with Mason Mueller on 03/19/24 at  8:00 AM EDT by audio-only telephone visit and verified that I am speaking with the correct person using two identifiers.   Patient location: Home Provider location: [facility]  I discussed the limitations, risks, security and privacy concerns of performing an evaluation and management service by telephone and the availability of in person appointments. We discussed the impact of the COVID-19 pandemic on the healthcare system, and the importance of social distancing and reducing patient and provider exposure. I also discussed with the patient that there may be a patient responsible charge related to this service. The patient expressed understanding and agreed to proceed.  I provided 20 minutes of time during this virtual encounter.  ---------------------------------------------------------------------------------------------------------   Reason for visit: Follow up elevated PSA  HPI: 83 y.o. male, follow up with me today Prostate MRI (02/27/24) - PIRADS 3 at left ant PZ, 127g gland  Prior HPI: 83 year old male here for initial evaluation of elevated PSA PSA 11.6 (02/2024) PSA 10.55 (12/2023) PSA 2.81 (2019)   Patient denies any recent changes in urinary habits, LUTS, dysuria, prostatitis   Fhx of CaP in brother (passed away age 89 unrelated causes)    Previously seen in this clinic in 2021 for BPH, prior bilateral hydroceles   Hx of CPAP, obesity BM 37, COPD, DM2 (A1c 6.5 - 09/2023)  Laboratory Data: N/A  Pertinent Imaging: MRI Prostate (02/27/24) - IMPRESSION: 1. PI-RADS category 3 lesion of the left anterior peripheral zone in the mid gland. Targeting data sent to [facility]. 2. Prostatomegaly and benign prostatic hypertrophy. Prominent median lobe protrudes into the urinary bladder.   Assessment & Plan:    Elevated PSA Assessment & Plan: PSA  11.6 (02/2024) PSA 2.81 (2019) AAM, Fhx of CaP in brother  MRI Prostate (Sept 2025) - PIRADS 3 only, 127g gland, PSAD 0.09  I reviewed his MRI, reassuring results.  PSA elevation likely secondary to very large BPH (~127 g).  Considering his age, I would not pursue aggressive workup /prostate biopsy for these findings.  Discontinue further routine PSA screening.  Orders: -     Finasteride; Take 1 tablet (5 mg total) by mouth daily.  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 Discussed potential etiologies including infectious, inflammatory, bladder-related, benign prostatic hyperplasia (BPH), and musculoskeletal/pelvic floor contributions. Reviewed the standard diagnostic workup (urinalysis, PVR, uroflow, prostate assessment, possible cystoscopy or imaging) and the spectrum of initial management strategies ranging from behavioral and lifestyle measures to pharmacologic therapy, with procedural options if indicated. All questions were addressed and the patient expressed understanding of the evaluation and treatment pathway.   - He still has inconsistent CPAP use-likely contributing to nocturia -Offered surgical consult with Dr. Francisca, who he saw in 2021 vs. Trial of Proscar for BPH    - he preferred Proscar for now - F/u in 4-5 months for symptom check, PVR  Orders: -     Finasteride; Take 1 tablet (5 mg total) by mouth daily.  Dispense: 30 tablet; Refill: 11       Penne JONELLE Skye, MD  [facility] [address] [place], [place] [address] [phone] 

## 2024-03-12 NOTE — Assessment & Plan Note (Addendum)
 PSA 11.6 (02/2024) PSA 2.81 (2019) AAM, Fhx of CaP in brother  MRI Prostate (Sept 2025) - PIRADS 3 only, 127g gland, PSAD 0.09  I reviewed his MRI, reassuring results.  PSA elevation likely secondary to very large BPH (~127 g).  Considering his age, I would not pursue aggressive workup /prostate biopsy for these findings.  Discontinue further routine PSA screening.

## 2024-03-19 ENCOUNTER — Telehealth: Admitting: Urology

## 2024-03-19 DIAGNOSIS — N138 Other obstructive and reflux uropathy: Secondary | ICD-10-CM | POA: Diagnosis not present

## 2024-03-19 DIAGNOSIS — R972 Elevated prostate specific antigen [PSA]: Secondary | ICD-10-CM | POA: Diagnosis not present

## 2024-03-19 DIAGNOSIS — N401 Enlarged prostate with lower urinary tract symptoms: Secondary | ICD-10-CM | POA: Diagnosis not present

## 2024-03-19 MED ORDER — FINASTERIDE 5 MG PO TABS: 5.0000 mg | ORAL_TABLET | Freq: Every day | ORAL | 11 refills | Status: AC

## 2024-03-19 NOTE — Assessment & Plan Note (Addendum)
 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 Discussed potential etiologies including infectious, inflammatory, bladder-related, benign prostatic hyperplasia (BPH), and musculoskeletal/pelvic floor contributions. Reviewed the standard diagnostic workup (urinalysis, PVR, uroflow, prostate assessment, possible cystoscopy or imaging) and the spectrum of initial management strategies ranging from behavioral and lifestyle measures to pharmacologic therapy, with procedural options if indicated. All questions were addressed and the patient expressed understanding of the evaluation and treatment pathway.   - He still has inconsistent CPAP use-likely contributing to nocturia -Offered surgical consult with Dr. Francisca, who he saw in 2021 vs. Trial of Proscar for BPH    - he preferred Proscar for now - F/u in 4-5 months for symptom check, PVR

## 2024-07-15 NOTE — Progress Notes (Unsigned)
" ° °  07/23/2024 1:31 PM   Mason Mueller 16-Oct-1940 [id number]  Reason for visit: Follow up BPH   HPI: 84 y.o. male, follow up with me today  Prior HPI: Hx of elevated PSA  -PSA 11.6 (02/2024)  -PSA 10.55 (12/2023)  -PSA 2.81 (2019)   -Prostate (Sept 2025) - PIRADS 3 only, 127g gland, PSAD 0.09      Fhx of CaP in brother (passed away age 1 unrelated causes)    Previously seen in this clinic in 2021 for BPH, prior bilateral hydroceles   Hx of CPAP, obesity BM 37, COPD, DM2 (A1c 6.5 - 09/2023)    Physical Exam: There were no vitals taken for this visit.   Constitutional:  Alert and oriented, No acute distress.  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 Discussed potential etiologies including infectious, inflammatory, bladder-related, benign prostatic hyperplasia (BPH), and musculoskeletal/pelvic floor contributions. Reviewed the standard diagnostic workup (urinalysis, PVR, uroflow, prostate assessment, possible cystoscopy or imaging) and the spectrum of initial management strategies ranging from behavioral and lifestyle measures to pharmacologic therapy, with procedural options if indicated. All questions were addressed and the patient expressed understanding of the evaluation and treatment pathway.   - He still has inconsistent CPAP use-likely contributing to nocturia -Offered surgical consult with Dr. Francisca, who he saw in 2021 vs. Trial of Proscar  for BPH    - he preferred Proscar  for now - F/u in 4-5 months for symptom check, PVR        Penne JONELLE Skye, MD  [facility] [address] [place], [place] [address] [phone] "

## 2024-07-15 NOTE — Assessment & Plan Note (Signed)
 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 Discussed potential etiologies including infectious, inflammatory, bladder-related, benign prostatic hyperplasia (BPH), and musculoskeletal/pelvic floor contributions. Reviewed the standard diagnostic workup (urinalysis, PVR, uroflow, prostate assessment, possible cystoscopy or imaging) and the spectrum of initial management strategies ranging from behavioral and lifestyle measures to pharmacologic therapy, with procedural options if indicated. All questions were addressed and the patient expressed understanding of the evaluation and treatment pathway.   - He still has inconsistent CPAP use-likely contributing to nocturia -Offered surgical consult with Dr. Francisca, who he saw in 2021 vs. Trial of Proscar for BPH    - he preferred Proscar for now - F/u in 4-5 months for symptom check, PVR

## 2024-07-23 ENCOUNTER — Ambulatory Visit: Admitting: Urology

## 2024-07-23 DIAGNOSIS — N138 Other obstructive and reflux uropathy: Secondary | ICD-10-CM

## 2025-01-26 ENCOUNTER — Other Ambulatory Visit

## 2025-02-09 ENCOUNTER — Ambulatory Visit: Admitting: Internal Medicine
